# Patient Record
Sex: Female | Born: 1960
Health system: Southern US, Community
[De-identification: ages and names within clinical notes are randomized; demographics above are authoritative.]

## PROBLEM LIST (undated history)

## (undated) DIAGNOSIS — B353 Tinea pedis: Secondary | ICD-10-CM

## (undated) DIAGNOSIS — M76821 Posterior tibial tendinitis, right leg: Secondary | ICD-10-CM

## (undated) DIAGNOSIS — M549 Dorsalgia, unspecified: Secondary | ICD-10-CM

## (undated) DIAGNOSIS — R002 Palpitations: Secondary | ICD-10-CM

## (undated) DIAGNOSIS — I10 Essential (primary) hypertension: Secondary | ICD-10-CM

## (undated) DIAGNOSIS — E785 Hyperlipidemia, unspecified: Secondary | ICD-10-CM

## (undated) DIAGNOSIS — M79671 Pain in right foot: Secondary | ICD-10-CM

## (undated) DIAGNOSIS — E0520 Thyrotoxicosis with toxic multinodular goiter without thyrotoxic crisis or storm: Secondary | ICD-10-CM

## (undated) DIAGNOSIS — E119 Type 2 diabetes mellitus without complications: Secondary | ICD-10-CM

## (undated) DIAGNOSIS — H8110 Benign paroxysmal vertigo, unspecified ear: Secondary | ICD-10-CM

## (undated) DIAGNOSIS — E040 Nontoxic diffuse goiter: Secondary | ICD-10-CM

## (undated) DIAGNOSIS — E6609 Other obesity due to excess calories: Secondary | ICD-10-CM

## (undated) DIAGNOSIS — Z9289 Personal history of other medical treatment: Secondary | ICD-10-CM

## (undated) DIAGNOSIS — Z808 Family history of malignant neoplasm of other organs or systems: Secondary | ICD-10-CM

## (undated) DIAGNOSIS — M2141 Flat foot [pes planus] (acquired), right foot: Secondary | ICD-10-CM

## (undated) DIAGNOSIS — N6089 Other benign mammary dysplasias of unspecified breast: Secondary | ICD-10-CM

## (undated) DIAGNOSIS — R011 Cardiac murmur, unspecified: Secondary | ICD-10-CM

## (undated) HISTORY — DX: Morbid (severe) obesity due to excess calories: E66.01

## (undated) HISTORY — DX: Personal history of other medical treatment: Z92.89

---
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
## 1998-08-01 ENCOUNTER — Emergency Department (HOSPITAL_COMMUNITY): Admission: EM | Admit: 1998-08-01 | Discharge: 1998-08-01 | Payer: Self-pay | Admitting: Emergency Medicine

## 1998-08-02 ENCOUNTER — Encounter: Admission: RE | Admit: 1998-08-02 | Discharge: 1998-08-02 | Payer: Self-pay | Admitting: Sports Medicine

## 1998-09-14 ENCOUNTER — Other Ambulatory Visit: Admission: RE | Admit: 1998-09-14 | Discharge: 1998-09-14 | Payer: Self-pay | Admitting: Family Medicine

## 1998-09-14 ENCOUNTER — Encounter: Admission: RE | Admit: 1998-09-14 | Discharge: 1998-09-14 | Payer: Self-pay | Admitting: Family Medicine

## 1998-09-23 ENCOUNTER — Encounter: Admission: RE | Admit: 1998-09-23 | Discharge: 1998-12-22 | Payer: Self-pay | Admitting: Family Medicine

## 1998-09-28 ENCOUNTER — Encounter: Admission: RE | Admit: 1998-09-28 | Discharge: 1998-09-28 | Payer: Self-pay | Admitting: Family Medicine

## 1998-11-09 ENCOUNTER — Encounter: Admission: RE | Admit: 1998-11-09 | Discharge: 1998-11-09 | Payer: Self-pay | Admitting: Family Medicine

## 1999-06-24 ENCOUNTER — Encounter: Admission: RE | Admit: 1999-06-24 | Discharge: 1999-06-24 | Payer: Self-pay | Admitting: Family Medicine

## 1999-06-28 ENCOUNTER — Encounter: Admission: RE | Admit: 1999-06-28 | Discharge: 1999-06-28 | Payer: Self-pay | Admitting: Sports Medicine

## 2000-01-19 HISTORY — PX: VAGINAL HYSTERECTOMY: SUR661

## 2000-04-17 ENCOUNTER — Encounter: Payer: Self-pay | Admitting: Sports Medicine

## 2000-04-17 ENCOUNTER — Encounter: Admission: RE | Admit: 2000-04-17 | Discharge: 2000-04-17 | Payer: Self-pay | Admitting: Family Medicine

## 2000-04-17 ENCOUNTER — Encounter: Admission: RE | Admit: 2000-04-17 | Discharge: 2000-04-17 | Payer: Self-pay | Admitting: Sports Medicine

## 2000-05-08 ENCOUNTER — Encounter: Admission: RE | Admit: 2000-05-08 | Discharge: 2000-05-08 | Payer: Self-pay | Admitting: Family Medicine

## 2000-06-05 ENCOUNTER — Encounter: Admission: RE | Admit: 2000-06-05 | Discharge: 2000-06-05 | Payer: Self-pay | Admitting: Family Medicine

## 2000-06-07 ENCOUNTER — Emergency Department (HOSPITAL_COMMUNITY): Admission: EM | Admit: 2000-06-07 | Discharge: 2000-06-07 | Payer: Self-pay | Admitting: Emergency Medicine

## 2000-11-06 ENCOUNTER — Encounter: Admission: RE | Admit: 2000-11-06 | Discharge: 2000-11-06 | Payer: Self-pay | Admitting: Sports Medicine

## 2000-11-21 ENCOUNTER — Encounter: Payer: Self-pay | Admitting: Obstetrics and Gynecology

## 2000-11-21 ENCOUNTER — Encounter: Admission: RE | Admit: 2000-11-21 | Discharge: 2000-11-21 | Payer: Self-pay | Admitting: Obstetrics and Gynecology

## 2001-01-21 ENCOUNTER — Encounter (INDEPENDENT_AMBULATORY_CARE_PROVIDER_SITE_OTHER): Payer: Self-pay | Admitting: Specialist

## 2001-01-21 ENCOUNTER — Inpatient Hospital Stay (HOSPITAL_COMMUNITY): Admission: RE | Admit: 2001-01-21 | Discharge: 2001-01-23 | Payer: Self-pay | Admitting: Obstetrics and Gynecology

## 2001-11-17 ENCOUNTER — Encounter (INDEPENDENT_AMBULATORY_CARE_PROVIDER_SITE_OTHER): Payer: Self-pay | Admitting: *Deleted

## 2001-11-17 LAB — CONVERTED CEMR LAB

## 2001-12-16 ENCOUNTER — Encounter: Admission: RE | Admit: 2001-12-16 | Discharge: 2001-12-16 | Payer: Self-pay | Admitting: Family Medicine

## 2002-10-07 ENCOUNTER — Encounter: Admission: RE | Admit: 2002-10-07 | Discharge: 2002-10-07 | Payer: Self-pay | Admitting: Family Medicine

## 2002-10-07 DIAGNOSIS — E66813 Obesity, class 3: Secondary | ICD-10-CM

## 2002-10-07 HISTORY — DX: Obesity, class 3: E66.813

## 2002-10-07 HISTORY — DX: Morbid (severe) obesity due to excess calories: E66.01

## 2002-11-04 ENCOUNTER — Encounter: Admission: RE | Admit: 2002-11-04 | Discharge: 2002-11-04 | Payer: Self-pay | Admitting: Family Medicine

## 2002-12-23 ENCOUNTER — Encounter: Admission: RE | Admit: 2002-12-23 | Discharge: 2002-12-23 | Payer: Self-pay | Admitting: Family Medicine

## 2003-04-14 ENCOUNTER — Encounter: Admission: RE | Admit: 2003-04-14 | Discharge: 2003-04-14 | Payer: Self-pay | Admitting: Family Medicine

## 2003-04-21 ENCOUNTER — Encounter: Admission: RE | Admit: 2003-04-21 | Discharge: 2003-04-21 | Payer: Self-pay | Admitting: Family Medicine

## 2004-05-03 ENCOUNTER — Encounter: Admission: RE | Admit: 2004-05-03 | Discharge: 2004-05-03 | Payer: Self-pay | Admitting: Family Medicine

## 2004-05-20 ENCOUNTER — Encounter: Admission: RE | Admit: 2004-05-20 | Discharge: 2004-05-20 | Payer: Self-pay | Admitting: Sports Medicine

## 2004-08-30 ENCOUNTER — Ambulatory Visit: Payer: Self-pay | Admitting: Family Medicine

## 2005-04-25 ENCOUNTER — Ambulatory Visit: Payer: Self-pay | Admitting: Family Medicine

## 2005-05-29 ENCOUNTER — Ambulatory Visit: Payer: Self-pay | Admitting: Family Medicine

## 2005-08-22 ENCOUNTER — Ambulatory Visit: Payer: Self-pay | Admitting: Family Medicine

## 2006-01-07 ENCOUNTER — Emergency Department (HOSPITAL_COMMUNITY): Admission: EM | Admit: 2006-01-07 | Discharge: 2006-01-07 | Payer: Self-pay | Admitting: Family Medicine

## 2006-05-10 ENCOUNTER — Ambulatory Visit: Payer: Self-pay | Admitting: Family Medicine

## 2007-02-14 DIAGNOSIS — E6609 Other obesity due to excess calories: Secondary | ICD-10-CM

## 2007-02-14 DIAGNOSIS — R011 Cardiac murmur, unspecified: Secondary | ICD-10-CM

## 2007-02-14 DIAGNOSIS — E66811 Obesity, class 1: Secondary | ICD-10-CM

## 2007-02-14 DIAGNOSIS — I10 Essential (primary) hypertension: Secondary | ICD-10-CM

## 2007-02-14 DIAGNOSIS — J309 Allergic rhinitis, unspecified: Secondary | ICD-10-CM | POA: Insufficient documentation

## 2007-02-14 DIAGNOSIS — E785 Hyperlipidemia, unspecified: Secondary | ICD-10-CM

## 2007-02-14 DIAGNOSIS — E1165 Type 2 diabetes mellitus with hyperglycemia: Secondary | ICD-10-CM | POA: Insufficient documentation

## 2007-02-14 DIAGNOSIS — I152 Hypertension secondary to endocrine disorders: Secondary | ICD-10-CM | POA: Insufficient documentation

## 2007-02-14 DIAGNOSIS — E119 Type 2 diabetes mellitus without complications: Secondary | ICD-10-CM

## 2007-02-14 DIAGNOSIS — E1169 Type 2 diabetes mellitus with other specified complication: Secondary | ICD-10-CM | POA: Insufficient documentation

## 2007-02-14 HISTORY — DX: Type 2 diabetes mellitus without complications: E11.9

## 2007-02-14 HISTORY — DX: Other obesity due to excess calories: E66.09

## 2007-02-14 HISTORY — DX: Obesity, class 1: E66.811

## 2007-02-14 HISTORY — DX: Hyperlipidemia, unspecified: E78.5

## 2007-02-14 HISTORY — DX: Cardiac murmur, unspecified: R01.1

## 2007-02-15 ENCOUNTER — Encounter (INDEPENDENT_AMBULATORY_CARE_PROVIDER_SITE_OTHER): Payer: Self-pay | Admitting: *Deleted

## 2007-05-21 ENCOUNTER — Encounter: Payer: Self-pay | Admitting: Family Medicine

## 2007-05-21 ENCOUNTER — Ambulatory Visit: Payer: Self-pay | Admitting: Family Medicine

## 2007-05-21 LAB — CONVERTED CEMR LAB
AST: 11 units/L (ref 0–37)
Albumin: 4 g/dL (ref 3.5–5.2)
Alkaline Phosphatase: 60 units/L (ref 39–117)
BUN: 13 mg/dL (ref 6–23)
Calcium: 9.3 mg/dL (ref 8.4–10.5)
Creatinine, Ser: 0.72 mg/dL (ref 0.40–1.20)
Glucose, Bld: 105 mg/dL — ABNORMAL HIGH (ref 70–99)
HDL: 61 mg/dL (ref >39)
LDL Cholesterol: 124 mg/dL — ABNORMAL HIGH (ref 0–99)
Potassium: 4 meq/L (ref 3.5–5.3)
Total CHOL/HDL Ratio: 3.2
Triglycerides: 55 mg/dL (ref <150)

## 2007-05-28 ENCOUNTER — Encounter (INDEPENDENT_AMBULATORY_CARE_PROVIDER_SITE_OTHER): Payer: Self-pay | Admitting: *Deleted

## 2008-06-11 ENCOUNTER — Ambulatory Visit: Payer: Self-pay | Admitting: Family Medicine

## 2008-06-11 ENCOUNTER — Encounter: Payer: Self-pay | Admitting: Family Medicine

## 2008-06-11 LAB — CONVERTED CEMR LAB
BUN: 9 mg/dL (ref 6–23)
Chloride: 105 meq/L (ref 96–112)
Creatinine, Ser: 0.72 mg/dL (ref 0.40–1.20)
Glucose, Bld: 181 mg/dL — ABNORMAL HIGH (ref 70–99)
Potassium: 4.1 meq/L (ref 3.5–5.3)

## 2008-06-12 ENCOUNTER — Encounter: Payer: Self-pay | Admitting: Family Medicine

## 2008-06-24 ENCOUNTER — Encounter: Admission: RE | Admit: 2008-06-24 | Discharge: 2008-06-24 | Payer: Self-pay | Admitting: Family Medicine

## 2008-09-29 ENCOUNTER — Telehealth: Payer: Self-pay | Admitting: *Deleted

## 2008-09-29 ENCOUNTER — Encounter: Payer: Self-pay | Admitting: *Deleted

## 2008-10-20 ENCOUNTER — Ambulatory Visit: Payer: Self-pay | Admitting: Family Medicine

## 2008-10-20 LAB — CONVERTED CEMR LAB: Hgb A1c MFr Bld: 7.8 %

## 2008-10-21 ENCOUNTER — Encounter: Payer: Self-pay | Admitting: Family Medicine

## 2008-10-21 ENCOUNTER — Ambulatory Visit: Payer: Self-pay | Admitting: Family Medicine

## 2008-10-21 LAB — CONVERTED CEMR LAB
ALT: 15 units/L (ref 0–35)
BUN: 11 mg/dL (ref 6–23)
CO2: 25 meq/L (ref 19–32)
Calcium: 9.2 mg/dL (ref 8.4–10.5)
Chloride: 100 meq/L (ref 96–112)
Cholesterol: 197 mg/dL (ref 0–200)
Creatinine, Ser: 0.67 mg/dL (ref 0.40–1.20)
Glucose, Bld: 121 mg/dL — ABNORMAL HIGH (ref 70–99)
HCT: 36.4 % (ref 36.0–46.0)
HDL: 58 mg/dL (ref >39)
Hemoglobin: 11.9 g/dL — ABNORMAL LOW (ref 12.0–15.0)
MCV: 91.9 fL (ref 78.0–100.0)
Total CHOL/HDL Ratio: 3.4
Triglycerides: 65 mg/dL (ref <150)
WBC: 6.7 10*3/uL (ref 4.0–10.5)

## 2008-10-22 ENCOUNTER — Encounter: Payer: Self-pay | Admitting: Family Medicine

## 2009-02-23 ENCOUNTER — Ambulatory Visit: Payer: Self-pay | Admitting: Family Medicine

## 2009-02-23 LAB — CONVERTED CEMR LAB: Hgb A1c MFr Bld: 8.1 %

## 2010-03-08 ENCOUNTER — Ambulatory Visit: Payer: Self-pay | Admitting: Family Medicine

## 2010-03-08 DIAGNOSIS — E040 Nontoxic diffuse goiter: Secondary | ICD-10-CM

## 2010-03-08 DIAGNOSIS — M542 Cervicalgia: Secondary | ICD-10-CM

## 2010-03-08 HISTORY — DX: Nontoxic diffuse goiter: E04.0

## 2010-04-19 ENCOUNTER — Encounter: Payer: Self-pay | Admitting: Family Medicine

## 2010-04-19 ENCOUNTER — Ambulatory Visit: Payer: Self-pay | Admitting: Family Medicine

## 2010-04-19 LAB — CONVERTED CEMR LAB
Albumin: 4 g/dL (ref 3.5–5.2)
Alkaline Phosphatase: 55 units/L (ref 39–117)
BUN: 11 mg/dL (ref 6–23)
Calcium: 8.9 mg/dL (ref 8.4–10.5)
Creatinine, Ser: 0.67 mg/dL (ref 0.40–1.20)
Glucose, Bld: 139 mg/dL — ABNORMAL HIGH (ref 70–99)
HCT: 37.3 % (ref 36.0–46.0)
HDL: 51 mg/dL (ref >39)
Hemoglobin: 12.1 g/dL (ref 12.0–15.0)
LDL Cholesterol: 82 mg/dL (ref 0–99)
MCHC: 32.4 g/dL (ref 30.0–36.0)
Potassium: 3.9 meq/L (ref 3.5–5.3)
RBC: 4.14 M/uL (ref 3.87–5.11)
Triglycerides: 50 mg/dL (ref <150)

## 2010-05-02 ENCOUNTER — Encounter: Payer: Self-pay | Admitting: Family Medicine

## 2010-05-24 ENCOUNTER — Ambulatory Visit: Payer: Self-pay | Admitting: Family Medicine

## 2010-10-18 ENCOUNTER — Encounter (INDEPENDENT_AMBULATORY_CARE_PROVIDER_SITE_OTHER): Payer: Self-pay | Admitting: Pharmacist

## 2010-10-25 ENCOUNTER — Encounter: Admission: RE | Admit: 2010-10-25 | Discharge: 2010-10-25 | Payer: Self-pay | Admitting: Family Medicine

## 2010-11-15 ENCOUNTER — Ambulatory Visit: Payer: Self-pay | Admitting: Family Medicine

## 2011-01-08 ENCOUNTER — Encounter: Payer: Self-pay | Admitting: Sports Medicine

## 2011-01-17 NOTE — Assessment & Plan Note (Signed)
Summary: cpe,df   Vital Signs:  Patient profile:   50 year old female Height:      65 inches Weight:      247 pounds BMI:     41.25 Temp:     98.1 degrees F oral Pulse rate:   88 / minute BP sitting:   132 / 84  (right arm) Cuff size:   large  Vitals Entered By: Tessie Fass CMA (March 08, 2010 3:44 PM) CC: F/U BP and diabetes Is Patient Diabetic? Yes Pain Assessment Patient in pain? no        Primary Care Provider:  Zachery Dauer MD  CC:  F/U BP and diabetes.  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 Gets a lot of walking in on the second job, but no other exercise. Neck has been hurting into the left shoulder, but  no arm symptoms.   The glucose monitor she got from the nutrition counseling broke after a month.We reviewed symptoms of hypo and hyperglycemia and she denied either type. She has been taking the Metformin 1 two times a day, and now is slightly constipated. Stopped Glipizide after one month due to shakiness. Hasn't been checking sugars due to no strips having been provided.   Using athletes foot cream between toes for irritation there.  Habits & Providers  Alcohol-Tobacco-Diet     Tobacco Status: never  Allergies: No Known Drug Allergies  Physical Exam  General:  Obese, multiple tattoosalert.   Neck:  Possible symmetric thyromegaly. Full range of motion. Pain upper left shoulder with rotation of neck to left  Lungs:  Normal respiratory effort, chest expands symmetrically. Lungs are clear to auscultation, no crackles or wheezes. Heart:  Normal rate and regular rhythm. S1 and S2 normal without gallop, murmur, click, rub or other extra sounds. Msk:  strength, sensation and reflexes normal in the arms.  Extremities:  complains of  toenails ingrowing. Slightly tender on corners, but no signs of infection.   Diabetes Management Exam:    Foot Exam (with socks and/or shoes not present):  Sensory-Pinprick/Light touch:          Left medial foot (L-4): normal          Left dorsal foot (L-5): normal          Left lateral foot (S-1): normal          Right medial foot (L-4): normal          Right dorsal foot (L-5): normal          Right lateral foot (S-1): normal       Sensory-Monofilament:          Left foot: normal          Right foot: normal       Inspection:          Left foot: normal          Right foot: normal       Nails:          Left foot: normal          Right foot: normal   Impression & Recommendations:  Problem # 1:  DIABETES MELLITUS II, UNCOMPLICATED (ICD-250.00) Discussed the complictions of poorly controlled DM The following medications were removed from the medication list:    Anacin 81 Mg Tbec (Aspirin)    Glipizide 5 Mg Tabs (Glipizide) .Marland Kitchen... Take one tab once daily Her updated medication  list for this problem includes:    Benazepril-hydrochlorothiazide 20-12.5 Mg Tabs (Benazepril-hydrochlorothiazide) .Marland Kitchen..Marland Kitchen Two times a day    Metformin Hcl 1000 Mg Tabs (Metformin hcl) ..... One and a half tab in am and one in pm    Aspirin 81 Mg Tbec (Aspirin) .Marland Kitchen... Take one tablet daily  Orders: A1C-[facility] (46962) [facility]- Est  Level 4 (99214)Future Orders: Comp Met-[facility] ([id number]) ... 02/20/2011  Problem # 2:  NECK PAIN, LEFT (ICD-723.1) No radicular signs, so likely related to degenerative joint disease. To get supprt pillow and position self so not looking up too much at work.  The following medications were removed from the medication list:    Anacin 81 Mg Tbec (Aspirin) Her updated medication list for this problem includes:    Aspirin 81 Mg Tbec (Aspirin) .Marland Kitchen... Take one tablet daily  Problem # 3:  MORBID OBESITY (ICD-278.01) Assessment: Deteriorated Patient made action plan to lose 2 lbs in next month.  Problem # 4:  HYPERTENSION, BENIGN SYSTEMIC (ICD-401.1) close to control Her updated medication list for this problem includes:     Benazepril-hydrochlorothiazide 20-12.5 Mg Tabs (Benazepril-hydrochlorothiazide) .Marland Kitchen..Marland Kitchen Two times a day  Orders: [facility]- Est  Level 4 (99214)Future Orders: Comp Met-[facility] ([id number]) ... 02/20/2011  Problem # 5:  HYPERLIPIDEMIA (ICD-272.4) Restart med then recheck. Her updated medication list for this problem includes:    Simvastatin 20 Mg Tabs (Simvastatin) .Marland Kitchen... Take 1 tab at bedtime  Orders: [facility]- Est  Level 4 (99214)Future Orders: Lipid-[facility] ([id number]) ... 02/20/2011  Problem # 6:  GOITER, SIMPLE (ICD-240.0) possibly enlargedd.  Future Orders: TSH-[facility] ([id number]) ... 03/20/2011  Problem # 7:  MURMUR, FUNCTIONAL (ICD-785.2) Not heard today  Complete Medication List: 1)  Benazepril-hydrochlorothiazide 20-12.5 Mg Tabs (Benazepril-hydrochlorothiazide) .... Two times a day 2)  Metformin Hcl 1000 Mg Tabs (Metformin hcl) .... One and a half tab in am and one in pm 3)  Simvastatin 20 Mg Tabs (Simvastatin) .... Take 1 tab at bedtime 4)  Freestyle Freedom Lite W/device Kit (Blood glucose monitoring suppl) .... Check blood sugar once daily 5)  Freestyle Lancets Misc (Lancets) .... Use as directed 6)  Freestyle Lite Test Strp (Glucose blood) .... Check your fasting blood sugar in the mornings. 7)  Aspirin 81 Mg Tbec (Aspirin) .... Take one tablet daily  Other Orders: Future Orders: CBC-[facility] ([id number]) ... 02/20/2011  Patient Instructions: 1)  Walk 15 minutes 3 days a week. 2)  Replace sugar sweetened drinks with water.  3)  Eat 3 meals daily. More fruits.  4)  Please schedule a follow-up appointment in 2 weeks.  5)  Please return for a FASTING Lipid Profile one(1) week before your next appointment as scheduled.  6)  Your A1c today is 9.9 Goal under 7.0 7)  You will increase the Metformin to 1 1/2 in the AM and 1 in the PM. 8)  Call Dr Sheffield Slider if bowels too loose or fasting blood sugars are staying above 200.  Prescriptions: SIMVASTATIN 20 MG TABS (SIMVASTATIN) Take 1 tab at bedtime  #30  x 11   Entered and Authorized by:   Zachery Dauer MD   Signed by:   Zachery Dauer MD on 03/08/2010   Method used:   Electronically to        [facility] [phone]* (retail)       [address]       [place], [place]  [address]       Ph: [phone] or [phone]  Fax: [phone]   RxID:   [id number] BENAZEPRIL-HYDROCHLOROTHIAZIDE 20-12.5 MG  TABS (BENAZEPRIL-HYDROCHLOROTHIAZIDE) two times a day  #60 Tablet x 10   Entered and Authorized by:   Zachery Dauer MD   Signed by:   Zachery Dauer MD on 03/08/2010   Method used:   [facility] to        [facility]. [phone]* (retail)       [address] [place]  [address]       Ph: [phone] or [phone]       Fax: [phone]   RxID:   [id number] METFORMIN HCL 1000 MG  TABS (METFORMIN HCL) One and a half tab in AM and one in PM  #75 x 2   Entered and Authorized by:   Zachery Dauer MD   Signed by:   Zachery Dauer MD on 03/08/2010   Method used:   [facility]ron[facility] to        [facility] [phone]* (retail)       [address], [place]  [address]       Ph: [phone] or [phone]       Fax: [phone]   RxID:   [id number] FREESTYLE LITE TEST  STRP (GLUCOSE BLOOD) Check your fasting blood sugar in the mornings.  #1 bottle x 11   Entered and Authorized by:   Zachery Dauer MD   Signed by:   Zachery Dauer MD on 03/08/2010   Method used:   [facility]        [facility] [phone]* (retail)       [address]  [address]       Ph: [phone] or [phone]       Fax: [phone]   RxID:   [id number]   Laboratory Results   Blood Tests   Date/Time Received: March 08, 2010 3:37 PM  Date/Time Reported: March 08, 2010 3:53 PM   HGBA1C: 9.9%   (Normal Range: Non-Diabetic - 3-6%   Control Diabetic - 6-8%)  Comments: ...........test performed by...........Marland KitchenTerese Door, CMA      Prevention & Chronic Care Immunizations   Influenza vaccine: Not documented    Influenza vaccine due: Refused  (10/20/2008)    Tetanus booster: 04/17/2006: Done.   Tetanus booster due: 04/17/2016    Pneumococcal vaccine: Done.  (09/28/1998)   Pneumococcal vaccine due: None  Other Screening   Pap smear: Done.  (11/17/2001)   Pap smear action/deferral: Not indicated S/P hysterectomy  (03/08/2010)   Pap smear due: 11/17/2002    Mammogram: normal  (06/22/2008)   Mammogram due: 06/22/2009   Smoking status: never  (03/08/2010)  Diabetes Mellitus   HgbA1C: 9.9  (03/08/2010)   Hemoglobin A1C due: 09/11/2008    Eye exam: Not documented    Foot exam: yes  (03/08/2010)   High risk foot: Not documented   Foot care education: completed  (10/20/2008)   Foot exam due: 04/19/2009    Urine microalbumin/creatinine ratio: Not documented   Urine microalbumin/cr due: 05/20/2008    Diabetes flowsheet reviewed?: Yes   Progress toward A1C goal: Deteriorated  Lipids   Total Cholesterol: 197  (10/21/2008)   LDL: 126  (10/21/2008)   LDL Direct: Not documented   HDL: 58  (10/21/2008)   Triglycerides: 65  (10/21/2008)    SGOT (AST): 15  (10/21/2008)   SGPT (  ALT): 15  (10/21/2008) CMP ordered    Alkaline phosphatase: 55  (10/21/2008)   Total bilirubin: 0.7  (10/21/2008)    Lipid flowsheet reviewed?: Yes   Progress toward LDL goal: Unchanged  Hypertension   Last Blood Pressure: 132 / 84  (03/08/2010)   Serum creatinine: 0.67  (10/21/2008)   Serum potassium 3.8  (10/21/2008) CMP ordered     Hypertension flowsheet reviewed?: Yes   Progress toward BP goal: Deteriorated  Self-Management Support :   Personal Goals (by the next clinic visit) :     Personal A1C goal: 7  (03/08/2010)     Personal blood pressure goal: 130/80  (03/08/2010)     Personal LDL goal: 100  (03/08/2010)    Diabetes self-management support: Not documented    Hypertension self-management support: Not documented    Lipid self-management support: Not documented

## 2011-01-17 NOTE — Assessment & Plan Note (Signed)
Summary: fu/kh   Vital Signs:  Patient profile:   50 year old female Menstrual status:  hysterectomy Height:      65 inches Weight:      240.5 pounds BMI:     40.17 Pulse rate:   84 / minute BP sitting:   121 / 77  (left arm) Cuff size:   large  Vitals Entered By: Arlyss Repress CMA, (May 24, 2010 4:05 PM) CC: f/up DM Is Patient Diabetic? Yes Pain Assessment Patient in pain? no          Menstrual Status hysterectomy Last PAP Result Done.   Primary Care Provider:  Zachery Dauer MD  CC:  f/up DM.  History of Present Illness: Has been taking the Metformin without diarrhea. Walking 15 minutes 3 times weekly in addition to the walking that she does on her second job at [employer]. Also drinking Zango juice $40 a bottle that her daughter recommended. Not checking her sugars due to lack of strips. Feeling well. without foot problems. Still holding off eye exam till sugars stable. Afraid to take Glipizide due to the hypoglycemic episodes that she had.   Current Medications (verified): 1)  Benazepril-Hydrochlorothiazide 20-12.5 Mg  Tabs (Benazepril-Hydrochlorothiazide) .... Two Times A Day 2)  Metformin Hcl 1000 Mg  Tabs (Metformin Hcl) .... One and A Half Tab in Am and One in Pm 3)  Simvastatin 20 Mg Tabs (Simvastatin) .... Take 1 Tab At Bedtime 4)  Freestyle Freedom Lite W/device Kit (Blood Glucose Monitoring Suppl) .... Check Blood Sugar Once Daily 5)  Freestyle Lancets  Misc (Lancets) .... Use As Directed 6)  Freestyle Lite Test  Strp (Glucose Blood) .... Check Your Fasting Blood Sugar in The Mornings. 7)  Aspirin 81 Mg Tbec (Aspirin) .... Take One Tablet Daily 8)  Glyburide 1.25 Mg Tabs (Glyburide) .... Take One Tablet Daily  Allergies (verified): No Known Drug Allergies  Social History: divorced, had  boyfriend, broke it off, too immature Works [employer], evenings at [employer], [place], born [birth year] Non-smoker  Physical Exam  General:  Obese, multiple tattoo  salert.   Lungs:  Normal respiratory effort, chest expands symmetrically. Lungs are clear to auscultation, no crackles or wheezes. Heart:  Normal rate and regular rhythm. S1 and S2 normal Grade   1/6 systolic ejection murmur aortic area Extremities:  No pedal lesions    Impression & Recommendations:  Problem # 1:  DIABETES MELLITUS II, UNCOMPLICATED (ICD-250.00) Assessment Improved  Her updated medication list for this problem includes:    Benazepril-hydrochlorothiazide 20-12.5 Mg Tabs (Benazepril-hydrochlorothiazide) .Marland Kitchen..Marland Kitchen Two times a day    Metformin Hcl 1000 Mg Tabs (Metformin hcl) ..... One and a half tab in am and one in pm    Aspirin 81 Mg Tbec (Aspirin) .Marland Kitchen... Take one tablet daily    Glyburide 1.25 Mg Tabs (Glyburide) .Marland Kitchen... Take one tablet daily  Orders: A1C-[facility] (84696) [facility]- Est Level  3 (29528)  Problem # 2:  HYPERTENSION, BENIGN SYSTEMIC (ICD-401.1) Assessment: Improved  Her updated medication list for this problem includes:    Benazepril-hydrochlorothiazide 20-12.5 Mg Tabs (Benazepril-hydrochlorothiazide) .Marland Kitchen..Marland Kitchen Two times a day  Orders: [facility]- Est Level  3 (41324)  Problem # 3:  MORBID OBESITY (ICD-278.01) Assessment: Improved  Orders: [facility]- Est Level  3 (40102)  Problem # 4:  MURMUR, FUNCTIONAL (ICD-785.2) Assessment: Unchanged  Complete Medication List: 1)  Benazepril-hydrochlorothiazide 20-12.5 Mg Tabs (Benazepril-hydrochlorothiazide) .... Two times a day 2)  Metformin Hcl 1000 Mg Tabs (Metformin hcl) .... One and a  half tab in am and one in pm 3)  Simvastatin 20 Mg Tabs (Simvastatin) .... Take 1 tab at bedtime 4)  Freestyle Freedom Lite W/device Kit (Blood glucose monitoring suppl) .... Check blood sugar once daily 5)  Freestyle Lancets Misc (Lancets) .... Use as directed 6)  Freestyle Lite Test Strp (Glucose blood) .... Check your fasting blood sugar in the mornings. 7)  Aspirin 81 Mg Tbec (Aspirin) .... Take one tablet daily 8)  Glyburide 1.25 Mg Tabs  (Glyburide) .... Take one tablet daily  Patient Instructions: 1)  Increase your walking sessions by a minute each week. 2)  Add another exercise two days weekly for the same amount. 3)  Congratulations, your A1c is 8.3 which is much better than 9.9 4)  Please schedule a follow-up appointment in 3 months .  Prescriptions: GLYBURIDE 1.25 MG TABS (GLYBURIDE) Take one tablet daily  #30 x 5   Entered and Authorized by:   Zachery Dauer MD   Signed by:   Zachery Dauer MD on 05/24/2010   Method used:   [facility] to        [facility] [phone]* (retail)       [address] [place]  [address]       Ph: [phone] or [phone]       Fax: [phone]   RxID:   [id number]    Prevention & Chronic Care Immunizations   Influenza vaccine: Not documented   Influenza vaccine due: Refused  (10/20/2008)    Tetanus booster: 04/17/2006: Done.   Tetanus booster due: 04/17/2016    Pneumococcal vaccine: Done.  (09/28/1998)   Pneumococcal vaccine due: None  Other Screening   Pap smear: Done.  (11/17/2001)   Pap smear action/deferral: Not indicated S/P hysterectomy  (03/08/2010)   Pap smear due: 11/17/2002    Mammogram: normal  (06/22/2008)   Mammogram due: 06/22/2009   Smoking status: never  (03/08/2010)  Diabetes Mellitus   HgbA1C: 8.3  (05/24/2010)   Hemoglobin A1C due: 09/11/2008    Eye exam: Not documented    Foot exam: yes  (03/08/2010)   High risk foot: Not documented   Foot care education: completed  (10/20/2008)   Foot exam due: 04/19/2009    Urine microalbumin/creatinine ratio: Not documented   Urine microalbumin/cr due: 05/20/2008  Lipids   Total Cholesterol: 143  (04/19/2010)   LDL: 82  (04/19/2010)   LDL Direct: Not documented   HDL: 51  (04/19/2010)   Triglycerides: 50  (04/19/2010)    SGOT (AST): 10  (04/19/2010)   SGPT (ALT): 12  (04/19/2010)   Alkaline phosphatase: 55  (04/19/2010)   Total bilirubin: 0.9  (04/19/2010)    Lipid  flowsheet reviewed?: Yes   Progress toward LDL goal: At goal  Hypertension   Last Blood Pressure: 121 / 77  (05/24/2010)   Serum creatinine: 0.67  (04/19/2010)   Serum potassium 3.9  (04/19/2010)    Hypertension flowsheet reviewed?: Yes   Progress toward BP goal: At goal  Self-Management Support :   Personal Goals (by the next clinic visit) :     Personal A1C goal: 7  (03/08/2010)     Personal blood pressure goal: 130/80  (03/08/2010)     Personal LDL goal: 100  (03/08/2010)    Diabetes self-management support: Not documented    Hypertension self-management support: Not documented    Lipid self-management support: Not documented      Laboratory Results  Blood Tests   Date/Time Received: May 24, 2010 4:07 PM  Date/Time Reported: May 24, 2010 4:49 PM   HGBA1C: 8.3%   (Normal Range: Non-Diabetic - 3-6%   Control Diabetic - 6-8%)  Comments: ...........test performed by...........Marland KitchenTerese Door, CMA

## 2011-01-17 NOTE — Assessment & Plan Note (Signed)
Summary: f/u dm,df   Vital Signs:  Patient profile:   50 year old female Menstrual status:  hysterectomy Height:      65 inches Weight:      237 pounds BMI:     39.58 Temp:     98.6 degrees F oral Pulse rate:   80 / minute BP sitting:   93 / 62  (left arm) Cuff size:   large  Vitals Entered By: Tessie Fass CMA (November 15, 2010 4:32 PM)  Serial Vital Signs/Assessments:  Time      Position  BP       Pulse  Resp  Temp     By 5:07 PM             128/82                         Zachery Dauer MD  CC: F/U DM Is Patient Diabetic? Yes Pain Assessment Patient in pain? no        Primary Care Provider:  Zachery Dauer MD  CC:  F/U DM.  History of Present Illness: DM - no hypoglycemia symptoms. No diarrhea now that she is taking the Metformin regularly. Never got the Glipizide because she understood that she should try taking the Metformin regularly first.   Htn - not lightheaded.  Wt - has cut out sugar sweetened drinks. Not exercising. Still working 2 jobs.   Habits & Providers  Alcohol-Tobacco-Diet     Tobacco Status: never  Current Medications (verified): 1)  Benazepril-Hydrochlorothiazide 20-12.5 Mg  Tabs (Benazepril-Hydrochlorothiazide) .... Two Times A Day 2)  Metformin Hcl 1000 Mg  Tabs (Metformin Hcl) .... One and A Half Tab in Am and One in Pm 3)  Simvastatin 20 Mg Tabs (Simvastatin) .... Take 1 Tab At Bedtime 4)  Freestyle Freedom Lite W/device Kit (Blood Glucose Monitoring Suppl) .... Check Blood Sugar Once Daily 5)  Freestyle Lancets  Misc (Lancets) .... Use As Directed 6)  Freestyle Lite Test  Strp (Glucose Blood) .... Check Your Fasting Blood Sugar in The Mornings. 7)  Aspirin 81 Mg Tbec (Aspirin) .... Take One Tablet Daily 8)  Glyburide 1.25 Mg Tabs (Glyburide) .... Take One Tablet Daily  Allergies (verified): No Known Drug Allergies  Physical Exam  General:  Obese, multiple tattos alert.   Lungs:  Normal respiratory effort, chest expands symmetrically.  Lungs are clear to auscultation, no crackles or wheezes. Heart:  Normal rate and regular rhythm. S1 and S2 normal Grade   1/6 systolic ejection murmur aortic area   Impression & Recommendations:  Problem # 1:  DIABETES MELLITUS II, UNCOMPLICATED (ICD-250.00) Assessment Improved  Her updated medication list for this problem includes:    Benazepril-hydrochlorothiazide 20-12.5 Mg Tabs (Benazepril-hydrochlorothiazide) .Marland Kitchen..Marland Kitchen Two times a day    Metformin Hcl 1000 Mg Tabs (Metformin hcl) ..... One and a half tab in am and one in pm    Aspirin 81 Mg Tbec (Aspirin) .Marland Kitchen... Take one tablet daily    Glyburide 1.25 Mg Tabs (Glyburide) .Marland Kitchen... Take one tablet daily  Orders: A1C-[facility] (98119) [facility]- Est Level  3 (14782)  Problem # 2:  HYPERTENSION, BENIGN SYSTEMIC (ICD-401.1)  Initial low blood pressure appears to have been errroneous Her updated medication list for this problem includes:    Benazepril-hydrochlorothiazide 20-12.5 Mg Tabs (Benazepril-hydrochlorothiazide) .Marland Kitchen..Marland Kitchen Two times a day  Orders: [facility]- Est Level  3 (95621)  Problem # 3:  MORBID OBESITY (ICD-278.01)  Congratulated on weight loss. Encouraged weight  loss  Orders: [facility]- Est Level  3 (24401)  Complete Medication List: 1)  Benazepril-hydrochlorothiazide 20-12.5 Mg Tabs (Benazepril-hydrochlorothiazide) .... Two times a day 2)  Metformin Hcl 1000 Mg Tabs (Metformin hcl) .... One and a half tab in am and one in pm 3)  Simvastatin 20 Mg Tabs (Simvastatin) .... Take 1 tab at bedtime 4)  Freestyle Freedom Lite W/device Kit (Blood glucose monitoring suppl) .... Check blood sugar once daily 5)  Freestyle Lancets Misc (Lancets) .... Use as directed 6)  Freestyle Lite Test Strp (Glucose blood) .... Check your fasting blood sugar in the mornings. 7)  Aspirin 81 Mg Tbec (Aspirin) .... Take one tablet daily 8)  Glyburide 1.25 Mg Tabs (Glyburide) .... Take one tablet daily  Patient Instructions: 1)  Try high fiber pasta.  2)  Try adding more  walking 3)  Please schedule a follow-up appointment in 3 months .  4)  Congratulations, your A1c is  7.4 which is much better than 8.3 5)  Goal under 7.0 6)  Please schedule a follow-up appointment in 3 months .    Orders Added: 1)  A1C-[facility] [83036] 2)  [facility]- Est Level  3 [99213]    Laboratory Results   Blood Tests   Date/Time Received: November 15, 2010 4:30 PM  Date/Time Reported: November 15, 2010 4:51 PM   HGBA1C: 7.4%   (Normal Range: Non-Diabetic - 3-6%   Control Diabetic - 6-8%)  Comments: ...........test performed by...........Marland KitchenTessie Fass, CMA entered by Terese Door, CMA          Prevention & Chronic Care Immunizations   Influenza vaccine: Not documented   Influenza vaccine deferral: Refused  (11/15/2010)   Influenza vaccine due: Refused  (10/20/2008)    Tetanus booster: 04/17/2006: Done.   Tetanus booster due: 04/17/2016    Pneumococcal vaccine: Done.  (09/28/1998)   Pneumococcal vaccine due: None  Other Screening   Pap smear: Done.  (11/17/2001)   Pap smear action/deferral: Not indicated S/P hysterectomy  (03/08/2010)   Pap smear due: 11/17/2002    Mammogram: ASSESSMENT: Negative - BI-RADS 1^MM DIGITAL SCREENING  (10/25/2010)   Mammogram action/deferral: Screening mammogram in 1 year.     (10/25/2010)   Mammogram due: 06/22/2009   Smoking status: never  (11/15/2010)  Diabetes Mellitus   HgbA1C: 7.4  (11/15/2010)   Hemoglobin A1C due: 09/11/2008    Eye exam: Not documented    Foot exam: yes  (03/08/2010)   High risk foot: Not documented   Foot care education: completed  (10/20/2008)   Foot exam due: 04/19/2009    Urine microalbumin/creatinine ratio: Not documented   Urine microalbumin/cr due: 05/20/2008    Diabetes flowsheet reviewed?: Yes   Progress toward A1C goal: Improved  Lipids   Total Cholesterol: 143  (04/19/2010)   LDL: 82  (04/19/2010)   LDL Direct: Not documented   HDL: 51  (04/19/2010)   Triglycerides: 50   (04/19/2010)    SGOT (AST): 10  (04/19/2010)   SGPT (ALT): 12  (04/19/2010)   Alkaline phosphatase: 55  (04/19/2010)   Total bilirubin: 0.9  (04/19/2010)    Lipid flowsheet reviewed?: Yes   Progress toward LDL goal: At goal  Hypertension   Last Blood Pressure: 93 / 62  (11/15/2010)   Serum creatinine: 0.67  (04/19/2010)   Serum potassium 3.9  (04/19/2010)    Hypertension flowsheet reviewed?: Yes   Progress toward BP goal: At goal  Self-Management Support :   Personal Goals (by the next clinic visit) :  Personal A1C goal: 7  (03/08/2010)     Personal blood pressure goal: 130/80  (03/08/2010)     Personal LDL goal: 100  (03/08/2010)    Diabetes self-management support: Not documented    Hypertension self-management support: Not documented    Lipid self-management support: Not documented

## 2011-01-17 NOTE — Letter (Signed)
Summary: Results Follow-up Letter  [facility]  [address]   [place], [place] [address]   Phone: [phone]  Fax: [phone]    05/02/2010  [address] [place], [place]  [address]  Dear Ms. Apgar,   The following are the results of your recent test(s): Patient: Jasmine Dickson   Tests: (1) CBC NO Diff (Complete Blood Count) (10000)   Order Note: FASTING   WBC                       7.3 K/uL                    4.0-10.5   RBC                       4.14 MIL/uL                 3.87-5.11   Hemoglobin                12.1 g/dL                   57.8-46.9   Hematocrit                37.3 %                      36.0-46.0   MCV                       90.1 fL                     78.0-100.0 ! MCH                       29.2 pg                     26.0-34.0   MCHC                      32.4 g/dL                   62.9-52.8   RDW                       13.3 %                      11.5-15.5   Platelet Count            325 K/uL                    150-400 Your blood count is normal.   Tests: (2) Comprehensive Metabolic Panel (41324)   Sodium                    137 mEq/L                   135-145   Potassium                 3.9 mEq/L                   3.5-5.3   Chloride                  99 mEq/L  96-112   CO2                       25 mEq/L                    19-32   Glucose              [H]  139 mg/dL                   16-07   BUN                       11 mg/dL                    3-71   Creatinine                0.67 mg/dL                  0.40-1.20   Bilirubin, Total          0.9 mg/dL                   0.6-2.6   Alkaline Phosphatase      55 U/L                      39-117   AST/SGOT                  10 U/L                      0-37   ALT/SGPT                  12 U/L                      0-35   Total Protein             6.6 g/dL                    9.4-8.5   Albumin                   4.0 g/dL                    4.6-2.7   Calcium                   8.9 mg/dL                    0.3-50.0 Your liver and kidneys are normal Tests: (3) Lipid Profile (93818)   Cholesterol               143 mg/dL                   2-993        Triglyceride              50 mg/dL                    <716   HDL Cholesterol           51 mg/dL                    >96   Total Chol/HDL Ratio      2.8 Ratio  VLDL Cholesterol (Calc)  10 mg/dL                    1-61  LDL Cholesterol (Calc)                             82 mg/dL                    0-96           Total Cholesterol/HDL Ratio:CHD Risk                            Coronary Heart Disease Risk Table                                            Men       Women              1/2 Average Risk              3.4        3.3                  Average Risk              5.0        4.4              2 X Average Risk              9.6        7.1              3 X Average Risk             23.4       11.0     Use the calculated Patient Ratio above and the CHD Risk table      to determine the patient's CHD Risk.       Tests: (4) TSH (23280)   TSH                       1.411 uIU/mL                0.350-4.500     ***Test methodology is 3rd generation TSH*** Your thyroid is working normally Document Creation Date: 04/20/2010 12:46 AM _______________________________________________________________________ Cholesterol LDL(Bad cholesterol):   82       Your goal is less than: 100       HDL (Good cholesterol): 51       Your goal is more than: 50 _________________________________________________________  Sincerely,  Zachery Dauer MD [facility]            Appended Document: Results Follow-up Letter mailed.

## 2011-01-17 NOTE — Miscellaneous (Signed)
Summary: Orders Update  Clinical Lists Changes  Problems: Added new problem of ENCOUNTER FOR LONG-TERM USE OF OTHER MEDICATIONS (ICD-V58.69) Orders: Added new Test order of B12-[facility] (82607-23330) - Signed Added new Test order of CBC-[facility] (85027) - Signed   Ok per Dr. Hale 

## 2011-03-09 ENCOUNTER — Other Ambulatory Visit: Payer: Self-pay | Admitting: Family Medicine

## 2011-03-09 NOTE — Telephone Encounter (Signed)
Refill request

## 2011-03-27 ENCOUNTER — Telehealth: Payer: Self-pay | Admitting: Family Medicine

## 2011-03-27 ENCOUNTER — Other Ambulatory Visit: Payer: Self-pay | Admitting: Family Medicine

## 2011-03-27 MED ORDER — METFORMIN HCL 1000 MG PO TABS: 1000.0000 mg | ORAL_TABLET | ORAL | Status: DC

## 2011-03-27 MED ORDER — BENAZEPRIL-HYDROCHLOROTHIAZIDE 20-12.5 MG PO TABS: 1.0000 | ORAL_TABLET | Freq: Every day | ORAL | Status: DC

## 2011-03-27 NOTE — Telephone Encounter (Signed)
Pt states that she needs 90 day supply for her insurance to pay - Metformin & Benaz/HCTZ [facility] [place]

## 2011-04-04 ENCOUNTER — Encounter: Payer: Self-pay | Admitting: Family Medicine

## 2011-04-04 ENCOUNTER — Ambulatory Visit (INDEPENDENT_AMBULATORY_CARE_PROVIDER_SITE_OTHER): Payer: BC Managed Care – PPO | Admitting: Family Medicine

## 2011-04-04 VITALS — BP 119/79 | HR 90 | Ht 65.0 in | Wt 237.5 lb

## 2011-04-04 DIAGNOSIS — R011 Cardiac murmur, unspecified: Secondary | ICD-10-CM

## 2011-04-04 DIAGNOSIS — E040 Nontoxic diffuse goiter: Secondary | ICD-10-CM

## 2011-04-04 DIAGNOSIS — M25519 Pain in unspecified shoulder: Secondary | ICD-10-CM

## 2011-04-04 DIAGNOSIS — M542 Cervicalgia: Secondary | ICD-10-CM

## 2011-04-04 DIAGNOSIS — E669 Obesity, unspecified: Secondary | ICD-10-CM

## 2011-04-04 DIAGNOSIS — E785 Hyperlipidemia, unspecified: Secondary | ICD-10-CM

## 2011-04-04 DIAGNOSIS — E119 Type 2 diabetes mellitus without complications: Secondary | ICD-10-CM

## 2011-04-04 DIAGNOSIS — M25512 Pain in left shoulder: Secondary | ICD-10-CM | POA: Insufficient documentation

## 2011-04-04 LAB — POCT GLYCOSYLATED HEMOGLOBIN (HGB A1C): Hemoglobin A1C: 7.1

## 2011-04-04 MED ORDER — DICLOFENAC SODIUM 75 MG PO TBEC: 75.0000 mg | DELAYED_RELEASE_TABLET | Freq: Two times a day (BID) | ORAL | Status: DC

## 2011-04-04 NOTE — Patient Instructions (Addendum)
Please schedule an appointment in 6 months to see Dr Sheffield Slider  Schedule a fasting lab test in May for cholesterol   Recheck with me sooner if your neck gets worse. Get a memory foam pillow at [employer] and try to sleep on your back.

## 2011-04-04 NOTE — Progress Notes (Signed)
  Subjective:    Patient ID: Jasmine Dickson, female    DOB: February 13, 1961, 50 y.o.   MRN: [id number]  HPI intermittently she has left neck and shoulder pain that has not responded well to ibuprofen 800 mg. She denies any numbness or weakness in the left arm. She is to work a lot above head level has tried changing positions to counteract this. When painful is difficult to lift her left shoulder and it does bother her at nighttime. She does sleep on her sides and has to do a lot of readjusting her pillows. Her hands will obtain old awaken her at night so that she has to shake them. She tried her boyfriend's tramadol but is not helpf  She is occasional knee pains particularly in the right which tends to lock up.  She is warts in the right hand which are not bothersome.  Her athlete's foot and recurs and she uses over-the-counter medication to which her response  She has not been checking her blood sugars but has had no episodes of hypoglycemia.She is tolerating the metformin well without episodes of diarrhea.   Review of Systems  Constitutional: Positive for activity change. Negative for unexpected weight change.  HENT: Positive for neck pain.   Eyes: Negative for visual disturbance.  Respiratory: Negative for chest tightness.   Cardiovascular: Negative for chest pain.  Genitourinary: Negative for frequency.  Musculoskeletal: Positive for arthralgias.       Objective:   Physical Exam  Constitutional: She is oriented to person, place, and time. No distress.       Generalized obesity  Neck: Normal range of motion. Neck supple. No tracheal deviation present. No thyromegaly present.  Cardiovascular: Normal rate and regular rhythm.   Murmur heard.  Crescendo decrescendo systolic murmur is present with a grade of 2/6       LSB  Pulmonary/Chest: Effort normal and breath sounds normal.  Musculoskeletal: Normal range of motion.  Lymphadenopathy:    She has no cervical adenopathy.  Neurological:  She is alert and oriented to person, place, and time. No cranial nerve deficit.       Strength normal in the arms No thenar atrophy  Skin:       Warts on the fingers and palms, greater on the R   Desquamating skin between toes and on plantar surface of both feet. Tinea type changes in the toenails. L great toenail is horse shoe shaped and starting to ingrow medially.   Psychiatric: She has a normal mood and affect. Her behavior is normal. Judgment and thought content normal.          Assessment & Plan:

## 2011-04-05 NOTE — Assessment & Plan Note (Signed)
Appears to be musculoskeletal. No signs of radiculopathy. Mild bilateral carpal tunnel type symptoms.  

## 2011-04-05 NOTE — Assessment & Plan Note (Addendum)
Feels like overlying muscle thickening today. TSH normal last year.

## 2011-04-05 NOTE — Assessment & Plan Note (Addendum)
No symptoms to suggest that it is hemodynamically significant. Will check hemoglobin next blood draw.

## 2011-04-05 NOTE — Assessment & Plan Note (Signed)
Appears to be musculoskeletal. No signs of radiculopathy. Mild bilateral carpal tunnel type symptoms.

## 2011-04-05 NOTE — Assessment & Plan Note (Signed)
Close to ideal control. I emphasized the importance of reducing weight for diabetes and arthritis.

## 2011-05-05 NOTE — Op Note (Signed)
[facility]  Patient:    Jasmine Dickson, Jasmine Dickson                       MRN: [id number] Proc. Date: 01/21/01 Adm. Date:  [id number] Disc. Date: [id number] Attending:  Dierdre Forth Pearline                           Operative Report  PREOPERATIVE DIAGNOSES:       1. Menorrhagia.                               2. Uterine fibroids.                               3. Anemia.  POSTOPERATIVE DIAGNOSES:      1. Menorrhagia.                               2. Uterine fibroids.                               3. Anemia.  OPERATION:                    Total abdominal hysterectomy.  SURGEON:                      Vanessa P. Pennie Rushing, M.D.  FIRST ASSISTANT:              Henreitta Leber, P.A.-C.  ANESTHESIA:                   General orotracheal.  ESTIMATED BLOOD LOSS:         750 cc.  COMPLICATIONS:                None.  FINDINGS:                     The uterus was enlarged with a large, central, posterior myoma.  The entire uterus was approximately 14-16 weeks size.  The ovaries and tubes were normal bilaterally.  DESCRIPTION OF PROCEDURE:     The patient was taken to the operating room after appropriate identified and placed on the operating table.  After attainiNG adequate general anesthesia, with the patient in the supine position, the abdomen, perineum, and vagina were prepped with multiple layers of Betadine.  A Foley catheter was inserted into the bladder and connected to straight drainage.  The abdomen was draped as a sterile field.  A transverse incision was made in the abdomen, and the abdomen opened in layers.  The peritoneum was entered and pelvic washings obtained which were subsequently discarded.  Examination of the pelvis and upper abdomen revealed the fibroid uterus to be the only abnormality.  A self-retaining OConnor-OSullivan retractor was placed in the incision and the bowel packed cephalad.  Kelly clamps were placed at each cornual region, and the  left round ligament was suture ligated, then incised, and that incision taken to the anterior leaf of the broad ligament.  The uteroovarian ligament was identified, clamped, cut, tied with a free-tie, and suture ligated.  Similar procedure was carried out on the opposite side with the round ligament and the uteroovarian  ligament. The bladder was bluntly dissected off the anterior cervix with a sponge stick, and the right and left uterine arteries skeletonized, clamped, cut, and suture ligated.  The parametrial tissues were clamped, cut, and suture ligated, and the uterine fundus excised from the cervix and removed from the operative field.  The paracervical tissues were then clamped, cut, and suture ligated. The uterosacral ligaments on either side were clamped, cut, and suture ligated and those sutures held.  The vaginal angles were then clamped, cut, and suture ligated and those sutures held.  The remainder of the uterine cervix was removed from the upper vagina sharply and the vaginal cuff closed with figure-of-eight sutures.  All sutures to this point were 0 Vicryl.  Copious irrigation was carried out and a hemostatic suture placed between the left uterosacral and vaginal angle sutures to achieve adequate hemostasis.  The vaginal angle on the left side was tied to the uterosacral on that side and a similar procedure carried out with the uterosacral and vaginal angle on the right side.  All instruments were then removed from the peritoneal cavity and the abdominal peritoneum closed with a running suture of 3-0 Vicryl.  The rectus muscles were copiously irrigated and made hemostatic with Bovie cautery.  The rectus fascia was closed with a running suture of 0 Vicryl from each apex to the midline.  The subcutaneous tissue was copiously irrigated and made hemostatic with Bovie cautery.  Skin staples were applied to the skin incision.  A sterile dressing was applied and the patient awakened  from general anesthesia and then taken to the recovery room in satisfactory condition having tolerated the procedure well with sponge and instrument counts correct.  SPECIMENS TO PATHOLOGY:       Uterine fundus and uterine cervix. DD:  01/21/01 TD:  01/27/01 Job: [id number]

## 2011-05-05 NOTE — Discharge Summary (Signed)
[facility]  Patient:    Jasmine Dickson, Jasmine Dickson                       MRN: [id number] Adm. Date:  [id number] Disc. Date: [id number] Attending:  Shaune Spittle                           Discharge Summary  DISCHARGE DIAGNOSES:          1. Symptomatic uterine fibroids.                               2. Dysmenorrhea.                               3. Menorrhagia.                               4. Anemia.  OPERATION:                    On January 21, 2001 the patient underwent a total abdominal hysterectomy.  HISTORY OF PRESENT ILLNESS:   Jasmine Dickson is a 50 year old, married, African-American female with non-insulin-dependent diabetes and hypertension, who is para 1-0-0-1 presenting with a history of uterine fibroids, anemia, increasingly severe dysmenorrhea with menorrhagia. The patient is being admitted for the definitive therapy of her symptoms through total abdominal hysterectomy. Please see patients dictated history and physical exam for details.  PHYSICAL EXAMINATION:  VITAL SIGNS:                  Blood pressure 118/70. Weight is 254 pounds. Height is 5 feet 4 inches tall.  GENERAL EXAM:                 Within normal limits; however, heart exam reveals a 2/6 systolic ejection murmur that radiates into the upper chest (asymptomatic).  ABDOMEN:                      Palpable firm mass arising from the pelvis midway between the symphysis pubis and the umbilicus. This is nontender, irregular, and there is no organomegaly.  PELVIC:                       EGBUS within normal limits. Vagina is rugose. Cervix without gross lesions. Uterus is approximately 18-week size, irregular and nontender. Adnexa is unable to be assessed secondary to the extension of the uterus into the adnexal region; however, there is no tenderness.  RECTOVAGINAL:                 A large amount of stool.  HOSPITAL COURSE:              On January 21, 2001 the patient underwent  a total abdominal hysterectomy, tolerating the procedure well. The patients postoperative course was marked by blood pressure elevation, the maximum of which was 206/101; however, the patient responded well to Labetalol IV and was normalized by postoperative day #1. Throughout patients hypertensive episode, she did not experience any neurologic sequelae. The patients postoperative hemoglobin was 9.1 (preoperative hemoglobin 11.9). The patient resumed bowel and bladder function by postoperative day #2 and was deemed ready for discharge to home.  DISCHARGE MEDICATIONS:  1. Tylox one to two tablets every four to six                                  hours as needed for pain.                               2. Ibuprofen 600 mg one tablet every six hours                                  with food for the next five days.                               3. Iron 325 mg one tablet twice daily for six                                  weeks.                               4. Stool softener 60 mg one tablet twice daily                                  until bowel movements are regular.                               5. Phenergan 25 mg one tablet four times as                                  needed for nausea.  FOLLOWUP:                     The patient is scheduled to present to [facility] on January 28, 2001 at 11:30 a.m. for staple removal and application of Steri-strips. She also is to follow up with Jasmine Dickson on March 04, 2001 at 10:30 a.m. for her six-week postoperative exam.  DISCHARGE INSTRUCTIONS:       The patient was given a copy of [facility] postoperative instruction sheet. She was further advised to avoid driving for two weeks, heavy lifting for four weeks, and intercourse for 6 weeks.  FINAL PATHOLOGY:              Not available of the time of discharge.   DD: 01/23/01 TD:  01/23/01 Job: [id number]

## 2011-05-05 NOTE — H&P (Signed)
[facility]  Patient:    Jasmine Dickson, Jasmine Dickson                         MRN: [id number] Attending:  Maris Berger. Pennie Rushing, M.D. Dictator:   Henreitta Leber, P.A.-C.                         History and Physical  DATE OF BIRTH:                [birth year]  HISTORY OF PRESENT ILLNESS:   Jasmine Dickson is a 50 year old, married, African-American female, with non-insulin dependent diabetes, hypertension, who is para 1-0-0-1, presenting with a history of uterine fibroids, anemia, increasingly severe dysmenorrhea, and menorrhagia.  The patients transvaginal ultrasound of November 21, 2000, revealed a 9.2 cm submucosal fibroid in the body and fundus of her uterus as well as a 2.2 cm anterior fibroid on the left.  The patients left ovary was not visualized transvaginally; however, it appeared normal on a transabdominal study.  After discussing the options of observation, Depot Lupron, myomectomy, hysterectomy, and uterine artery embolization, the patient has decided to undergo hysterectomy as definitive treatment for her symptoms since she wanted surgical sterilization under any circumstances.  OBSTETRICAL HISTORY:          Gravida 1, para 1-0-0-1.  GYNECOLOGICAL HISTORY:        Menarche 50 years old.  Periods occur monthly. The patient had a negative gonorrhea and Chlamydia culture December 2001.  She has no history of abnormal Pap smears.  Her last Pap smear in November 2001 was normal.  PAST SURGICAL HISTORY:        Negative.  Nor has patient had a blood transfusion.  MEDICAL HISTORY:              Positive for hypertension, non-insulin dependent diabetes, and anemia.  FAMILY HISTORY:               Positive for thyroid disease, hypertension, and migraines.  SOCIAL HISTORY:               The patient is married, and she is employed with [employer], community as a Building surveyor.  HABITS:                       The patient does not use alcohol or tobacco.  CURRENT  MEDICATIONS:          1. Zovia 135.                               2. Glucophage XR 500 mg 2 tablets daily.                               3. Altace 5 mg 1 tablet daily.                               5. HCTZ 25 mg 1 tablet daily.  ALLERGIES:                     No known drug allergies.  REVIEW OF SYSTEMS:            Review of systems are positive for headaches with menstrual periods, constipation, and as  previously described in HPI. Otherwise negative.  PHYSICAL EXAMINATION:  GENERAL:                      This is an obese, African-American female, in no acute distress.  VITAL SIGNS:                  Blood pressure 118/70.  Weight is 254 pounds. Height is 5 feet 4 inches tall.  ENT:                          Within normal limits.  NECK:                         Supple without masses.  There is no thyromegaly.  LUNGS:                        No wheezes, rales, or rhonchi.  HEART:                        Regular rate and rhythm.  The patient has a 2/6 systolic ejection murmur that radiates into the upper chest (asymptomatic).  ABDOMEN:                      Bowel sound are present.  It is soft.  The patient does, however, have a palpable, firm mass, arising from the pelvis midway between the symphysis pubis and umbilicus, and it is nontender, irregular.  There is no organomegaly.  PELVIC:                       EG/BUS is within normal limits.  Vagina is rugose.  Cervix without gross lesions.  Uterus 18 weeks size, irregular, and nontender.  Adnexa unable to assess secondary to extension of uterus into the adnexal regions; however, there is no tenderness.  Rectovaginal with large amount of stool.  IMPRESSION:                   1. Symptomatic uterine fibroids.                               2. Dysmenorrhea.                               3. Menorrhagia.                               4. History of anemia.  DISPOSITION:                  After consideration of options for management  of symptomatic uterine fibroids, the patient has consented for a total abdominal hysterectomy.  She further verbalized understanding of the risks associated with this procedure to include, but are not limited to, reaction to anesthesia, bleeding, infection, and damage to adjacent organs.  The patient is scheduled to undergo a total abdominal hysterectomy at [facility] in [place] on January 21, 2001, at 7:30 a.m. DD:  01/15/01 TD:  01/15/01 Job: [id number]

## 2011-05-07 ENCOUNTER — Other Ambulatory Visit: Payer: Self-pay | Admitting: Family Medicine

## 2011-05-07 DIAGNOSIS — I10 Essential (primary) hypertension: Secondary | ICD-10-CM

## 2011-05-08 NOTE — Telephone Encounter (Signed)
Refill request

## 2011-08-11 ENCOUNTER — Other Ambulatory Visit: Payer: Self-pay | Admitting: Family Medicine

## 2011-08-11 NOTE — Telephone Encounter (Signed)
Refill request

## 2011-09-19 ENCOUNTER — Encounter: Payer: Self-pay | Admitting: Family Medicine

## 2011-09-19 ENCOUNTER — Ambulatory Visit (INDEPENDENT_AMBULATORY_CARE_PROVIDER_SITE_OTHER): Payer: BC Managed Care – PPO | Admitting: Family Medicine

## 2011-09-19 VITALS — BP 130/80 | HR 81 | Wt 242.0 lb

## 2011-09-19 DIAGNOSIS — E785 Hyperlipidemia, unspecified: Secondary | ICD-10-CM

## 2011-09-19 DIAGNOSIS — B353 Tinea pedis: Secondary | ICD-10-CM

## 2011-09-19 DIAGNOSIS — E119 Type 2 diabetes mellitus without complications: Secondary | ICD-10-CM

## 2011-09-19 DIAGNOSIS — E040 Nontoxic diffuse goiter: Secondary | ICD-10-CM

## 2011-09-19 DIAGNOSIS — I10 Essential (primary) hypertension: Secondary | ICD-10-CM

## 2011-09-19 DIAGNOSIS — E669 Obesity, unspecified: Secondary | ICD-10-CM

## 2011-09-19 DIAGNOSIS — M549 Dorsalgia, unspecified: Secondary | ICD-10-CM

## 2011-09-19 HISTORY — DX: Dorsalgia, unspecified: M54.9

## 2011-09-19 HISTORY — DX: Tinea pedis: B35.3

## 2011-09-19 LAB — POCT SKIN KOH: Skin KOH, POC: NEGATIVE

## 2011-09-19 MED ORDER — DICLOFENAC SODIUM 75 MG PO TBEC: 75.0000 mg | DELAYED_RELEASE_TABLET | Freq: Two times a day (BID) | ORAL | Status: DC

## 2011-09-19 MED ORDER — MICONAZOLE NITRATE 2 % EX CREA: TOPICAL_CREAM | Freq: Two times a day (BID) | CUTANEOUS | Status: AC

## 2011-09-19 NOTE — Assessment & Plan Note (Signed)
Worsened control due to not consistently taking meds due to finances. She says she will do better.

## 2011-09-19 NOTE — Assessment & Plan Note (Signed)
Musculoskeletal. Recommended acetaminophen and short courses of Diclofenac if needed

## 2011-09-19 NOTE — Patient Instructions (Signed)
Call for assistance if your situation is getting risky  Take the Diclofenac for 1 week twice daily for the back pain. May take with Acetominophen  Buy miconazole over the counter to apply twice daily to feet to treat fungus infection. Use a week after it looks like its gone.  Return in 3 months for recheck of blood pressure and A1c

## 2011-09-19 NOTE — Assessment & Plan Note (Signed)
well controlled  
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
## 2011-09-19 NOTE — Assessment & Plan Note (Signed)
4.5 lb weight gain since last visit

## 2011-09-19 NOTE — Assessment & Plan Note (Signed)
KOH negative, but recommended topical miconazole

## 2011-11-14 ENCOUNTER — Telehealth: Payer: Self-pay | Admitting: Family Medicine

## 2011-11-14 DIAGNOSIS — E785 Hyperlipidemia, unspecified: Secondary | ICD-10-CM

## 2011-11-14 MED ORDER — METFORMIN HCL 1000 MG PO TABS: ORAL_TABLET | ORAL | Status: DC

## 2011-11-14 NOTE — Telephone Encounter (Signed)
She hadn't realized that she was to come in for fasting labs. I will order the Metformin and she promised to call to schedule lab tests, will change to direct LDL since it's difficult for her to come in fasting.

## 2011-11-14 NOTE — Telephone Encounter (Signed)
Pharmacy is telling her that they can't refill her Metformin until doctor see her.  She is wanting to know if enough can be called in to last until January [facility]

## 2011-11-14 NOTE — Telephone Encounter (Signed)
Will forward to Dr Hale 

## 2011-11-22 ENCOUNTER — Other Ambulatory Visit: Payer: BC Managed Care – PPO

## 2011-11-22 DIAGNOSIS — E785 Hyperlipidemia, unspecified: Secondary | ICD-10-CM

## 2011-11-22 DIAGNOSIS — E040 Nontoxic diffuse goiter: Secondary | ICD-10-CM

## 2011-11-22 DIAGNOSIS — I10 Essential (primary) hypertension: Secondary | ICD-10-CM

## 2011-11-22 LAB — COMPREHENSIVE METABOLIC PANEL
ALT: 15 U/L (ref 0–35)
AST: 14 U/L (ref 0–37)
Albumin: 4 g/dL (ref 3.5–5.2)
Alkaline Phosphatase: 56 U/L (ref 39–117)
Chloride: 100 mEq/L (ref 96–112)
Potassium: 4 mEq/L (ref 3.5–5.3)
Sodium: 139 mEq/L (ref 135–145)
Total Protein: 6.5 g/dL (ref 6.0–8.3)

## 2011-11-22 NOTE — Progress Notes (Signed)
CMP,D-LDL,TSH DONE TODAY Jasmine Dickson 

## 2011-12-01 ENCOUNTER — Other Ambulatory Visit: Payer: Self-pay | Admitting: Family Medicine

## 2011-12-01 ENCOUNTER — Encounter: Payer: Self-pay | Admitting: Family Medicine

## 2011-12-01 MED ORDER — SIMVASTATIN 40 MG PO TABS: 40.0000 mg | ORAL_TABLET | Freq: Every day | ORAL | Status: DC

## 2012-01-11 ENCOUNTER — Ambulatory Visit (INDEPENDENT_AMBULATORY_CARE_PROVIDER_SITE_OTHER): Payer: BC Managed Care – PPO | Admitting: Family Medicine

## 2012-01-11 ENCOUNTER — Encounter: Payer: Self-pay | Admitting: Family Medicine

## 2012-01-11 DIAGNOSIS — H60399 Other infective otitis externa, unspecified ear: Secondary | ICD-10-CM

## 2012-01-11 DIAGNOSIS — I10 Essential (primary) hypertension: Secondary | ICD-10-CM

## 2012-01-11 DIAGNOSIS — H6090 Unspecified otitis externa, unspecified ear: Secondary | ICD-10-CM | POA: Insufficient documentation

## 2012-01-11 DIAGNOSIS — J069 Acute upper respiratory infection, unspecified: Secondary | ICD-10-CM

## 2012-01-11 MED ORDER — NEOMYCIN-POLYMYXIN-HC 3.5-10000-1 OT SUSP: 3.0000 [drp] | Freq: Four times a day (QID) | OTIC | Status: AC

## 2012-01-11 NOTE — Patient Instructions (Signed)
Avoid decongestants (pseudoephedrine, phenylephrine) they can raise your  Blood pressure  Dont use qtips.  Try hydrogen peroxide or mineral oil to soften up the wax.   You can buy drops OTC such as debrox.  Use prescription drops for 1 week

## 2012-01-11 NOTE — Assessment & Plan Note (Signed)
Right otitis externa.  Ciprodex x 1 week.  Discussed stopping use of qtips and discussed proper care and ear hygiene.

## 2012-01-11 NOTE — Progress Notes (Signed)
  Subjective:    Patient ID: Jasmine Dickson, female    DOB: 08-16-1961, 51 y.o.   MRN: [id number]  HPI Same day appt for cold symptoms  URI:  Sent home from work [employer]).  3 days of right ear pain, rhinorrhea, sneezing, coughing, chest congestion.  Coughing not severe, not keeping her up.  No fever, dyspnea, diarrhea, or emesis.     Notes some itchiness of throat and sneezing.  Ear pain: Notes right ear pain.  No ear drainage, change in hearing.  HTN:  BP elevated, last time was well controlled.  Sates has not taken meds this morning, also taking decongestants OTC Review of Systemssee above     Objective:   Physical Exam GEN: Alert & Oriented, No acute distress HEENT: [place]/AT. EOMI, PERRLA, no conjunctival injection or scleral icterus.  Bilateral ear canals occluded with cerumen.  After irrigation, left TM wnl, right tm unable to unclog with curette.  TM appears to be non erythematous behind cerumen.  Some maceration of ear canal.  Mild discomfort when manipulated external ear. Nares without edema or rhinorrhea.  Oropharynx is without erythema or exudates.  No anterior or posterior cervical lymphadenopathy. CV:  Regular Rate & Rhythm, no murmur Respiratory:  Normal work of breathing, CTAB Abd:  + BS, soft, no tenderness to palpation       Assessment & Plan:

## 2012-01-11 NOTE — Assessment & Plan Note (Addendum)
Continued elevation despite recheck.  Was well controlled at last visit.  Advised to discontinue decongestants.  Has not taken BP meds yet this morning.  Has upcoming appt with PCP, will recheck at that time.

## 2012-01-11 NOTE — Assessment & Plan Note (Addendum)
Glenford Peers without signs of bacterial infection.  Advised may try some antihistamine for sneezing/itching component of cold.   Advised she may return to work tomorrow.  Given she works in a [employer], advised frequent handwashing, and face mask while stil coughing.

## 2012-01-23 ENCOUNTER — Ambulatory Visit: Payer: BC Managed Care – PPO | Admitting: Family Medicine

## 2012-02-06 ENCOUNTER — Ambulatory Visit (INDEPENDENT_AMBULATORY_CARE_PROVIDER_SITE_OTHER): Payer: BC Managed Care – PPO | Admitting: Family Medicine

## 2012-02-06 ENCOUNTER — Encounter: Payer: Self-pay | Admitting: Family Medicine

## 2012-02-06 VITALS — BP 124/84 | HR 90 | Temp 98.4°F | Ht 65.0 in | Wt 241.0 lb

## 2012-02-06 DIAGNOSIS — E119 Type 2 diabetes mellitus without complications: Secondary | ICD-10-CM

## 2012-02-06 DIAGNOSIS — L259 Unspecified contact dermatitis, unspecified cause: Secondary | ICD-10-CM

## 2012-02-06 DIAGNOSIS — N898 Other specified noninflammatory disorders of vagina: Secondary | ICD-10-CM

## 2012-02-06 DIAGNOSIS — E785 Hyperlipidemia, unspecified: Secondary | ICD-10-CM

## 2012-02-06 DIAGNOSIS — B353 Tinea pedis: Secondary | ICD-10-CM

## 2012-02-06 DIAGNOSIS — I10 Essential (primary) hypertension: Secondary | ICD-10-CM

## 2012-02-06 LAB — POCT GLYCOSYLATED HEMOGLOBIN (HGB A1C): Hemoglobin A1C: 7.6

## 2012-02-06 MED ORDER — SIMVASTATIN 40 MG PO TABS: 40.0000 mg | ORAL_TABLET | Freq: Every day | ORAL | Status: DC

## 2012-02-06 MED ORDER — PIOGLITAZONE HCL 15 MG PO TABS: 15.0000 mg | ORAL_TABLET | Freq: Every day | ORAL | Status: DC

## 2012-02-06 MED ORDER — METFORMIN HCL 1000 MG PO TABS: ORAL_TABLET | ORAL | Status: DC

## 2012-02-06 NOTE — Patient Instructions (Signed)
Call Dr Sheffield Slider with fasting blood sugar results in one month  Start Pioglitazone 15 mg one tablet daily. Call if you develop swelling or other side effects.   Please return to see Dr Sheffield Slider in 3 months.

## 2012-02-07 DIAGNOSIS — L259 Unspecified contact dermatitis, unspecified cause: Secondary | ICD-10-CM | POA: Insufficient documentation

## 2012-02-07 NOTE — Assessment & Plan Note (Signed)
Avoid soap. Hydrocortisone 1% tid for 1 - 2 weeks.

## 2012-02-07 NOTE — Assessment & Plan Note (Signed)
Unchanged. Encouraged weight loss 

## 2012-02-07 NOTE — Assessment & Plan Note (Signed)
well controlled  

## 2012-02-07 NOTE — Assessment & Plan Note (Signed)
No improvement in A1c, no contraindications to adding Actos.  Encouraged to further restrict calorie intake.

## 2012-02-07 NOTE — Assessment & Plan Note (Signed)
Asked her to restart Simvastatin. Do direct LDL next visit

## 2012-02-07 NOTE — Progress Notes (Signed)
  Subjective:    Patient ID: Jasmine Dickson, female    DOB: 01-10-1961, 51 y.o.   MRN: [id number]  HPIShe Continues her medications the same, but hasn't taken Diclofenac for some time.   She hasn't been testing her cbg's because her insurance won't cover the strips. Looking into places to get an eye exam so she can also get new glasses. Hasn't been able to diet, but is avoiding sugar sweetened drinks.   Past 2 weeks has had a mildly itchy rash on left lower face and anterior neck. Uses moisturizing soap that she hasn't changed.   No chest pain, shortness of breath, or ankle edema.Continues dry skin on her feet, but no sores. Good sensation.    Review of Systems     Objective:   Physical Exam  Constitutional:       Generalized obesity  Neck: Thyromegaly present.       Mildly enlarged thyroid  Cardiovascular: Normal rate and regular rhythm.   Pulmonary/Chest: Effort normal and breath sounds normal.  Skin:       Superficial patches of erythema lower left face and anterior neck  Superficial desquamation sores of feet with no interdigital changes. Little toenails are thickened.   Psychiatric: She has a normal mood and affect.          Assessment & Plan:

## 2012-04-21 ENCOUNTER — Other Ambulatory Visit: Payer: Self-pay | Admitting: Family Medicine

## 2012-05-17 ENCOUNTER — Other Ambulatory Visit: Payer: Self-pay | Admitting: Family Medicine

## 2012-05-17 NOTE — Telephone Encounter (Signed)
She is due for lab tests, A1c and follow up visit

## 2012-07-06 ENCOUNTER — Other Ambulatory Visit: Payer: Self-pay | Admitting: Family Medicine

## 2012-08-01 ENCOUNTER — Other Ambulatory Visit: Payer: Self-pay | Admitting: Family Medicine

## 2012-08-01 NOTE — Telephone Encounter (Signed)
Patient needs to be seen before further refills

## 2012-09-21 ENCOUNTER — Other Ambulatory Visit: Payer: Self-pay | Admitting: Family Medicine

## 2012-09-23 NOTE — Telephone Encounter (Signed)
Needs office visit before further refills

## 2012-09-28 ENCOUNTER — Other Ambulatory Visit: Payer: Self-pay | Admitting: Family Medicine

## 2012-09-29 NOTE — Telephone Encounter (Signed)
No more refills until seen

## 2012-11-05 ENCOUNTER — Ambulatory Visit (INDEPENDENT_AMBULATORY_CARE_PROVIDER_SITE_OTHER): Payer: BC Managed Care – PPO | Admitting: Family Medicine

## 2012-11-05 ENCOUNTER — Encounter: Payer: Self-pay | Admitting: Family Medicine

## 2012-11-05 VITALS — BP 129/79 | HR 76 | Temp 98.2°F | Ht 65.0 in | Wt 239.0 lb

## 2012-11-05 DIAGNOSIS — E040 Nontoxic diffuse goiter: Secondary | ICD-10-CM

## 2012-11-05 DIAGNOSIS — B353 Tinea pedis: Secondary | ICD-10-CM

## 2012-11-05 DIAGNOSIS — E785 Hyperlipidemia, unspecified: Secondary | ICD-10-CM

## 2012-11-05 DIAGNOSIS — R011 Cardiac murmur, unspecified: Secondary | ICD-10-CM

## 2012-11-05 DIAGNOSIS — E1165 Type 2 diabetes mellitus with hyperglycemia: Secondary | ICD-10-CM

## 2012-11-05 DIAGNOSIS — I10 Essential (primary) hypertension: Secondary | ICD-10-CM

## 2012-11-05 DIAGNOSIS — IMO0002 Reserved for concepts with insufficient information to code with codable children: Secondary | ICD-10-CM

## 2012-11-05 LAB — POCT GLYCOSYLATED HEMOGLOBIN (HGB A1C): Hemoglobin A1C: 6.7

## 2012-11-05 NOTE — Assessment & Plan Note (Signed)
Check TSH 

## 2012-11-05 NOTE — Patient Instructions (Addendum)
Athlete's Foot Athlete's foot (tinea pedis) is a fungal infection of the skin on the feet. It often occurs on the skin between the toes or underneath the toes. It can also occur on the soles of the feet. Athlete's foot is more likely to occur in hot, humid weather. Not washing your feet or changing your socks often enough can contribute to athlete's foot. The infection can spread from person to person (contagious). CAUSES Athlete's foot is caused by a fungus. This fungus thrives in warm, moist places. Most people get athlete's foot by sharing shower stalls, towels, and wet floors with an infected person. People with weakened immune systems, including those with diabetes, may be more likely to get athlete's foot. SYMPTOMS   Itchy areas between the toes or on the soles of the feet.  White, flaky, or scaly areas between the toes or on the soles of the feet.  Tiny, intensely itchy blisters between the toes or on the soles of the feet.  Tiny cuts on the skin. These cuts can develop a bacterial infection.  Thick or discolored toenails. DIAGNOSIS  Your caregiver can usually tell what the problem is by doing a physical exam. Your caregiver may also take a skin sample from the rash area. The skin sample may be examined under a microscope, or it may be tested to see if fungus will grow in the sample. A sample may also be taken from your toenail for testing. TREATMENT  Over-the-counter and prescription medicines can be used to kill the fungus. These medicines are available as powders or creams. Your caregiver can suggest medicines for you. Fungal infections respond slowly to treatment. You may need to continue using your medicine for several weeks. Clotrimazole or Miconazole creams twice for 3 weeks. PREVENTION   Do not share towels.  Wear sandals in wet areas, such as shared locker rooms and shared showers.  Keep your feet dry. Wear shoes that allow air to circulate. Wear cotton or wool socks. HOME  CARE INSTRUCTIONS   Take medicines as directed by your caregiver. Do not use steroid creams on athlete's foot.  Keep your feet clean and cool. Wash your feet daily and dry them thoroughly, especially between your toes.  Change your socks every day. Wear cotton or wool socks. In hot climates, you may need to change your socks 2 to 3 times per day.  Wear sandals or canvas tennis shoes with good air circulation.  If you have blisters, soak your feet in Burow's solution or Epsom salts for 20 to 30 minutes, 2 times a day to dry out the blisters. Make sure you dry your feet thoroughly afterward. SEEK MEDICAL CARE IF:   You have a fever.  You have swelling, soreness, warmth, or redness in your foot.  You are not getting better after 7 days of treatment.  You are not completely cured after 30 days.  You have any problems caused by your medicines. MAKE SURE YOU:   Understand these instructions.  Will watch your condition.  Will get help right away if you are not doing well or get worse. Document Released: 12/01/2000 Document Revised: 02/26/2012 Document Reviewed: 09/22/2011 [facility] Patient Information 2013 [place], [place].   I will send you your lab results  Please return to see Dr Sheffield Slider in 6 months.

## 2012-11-05 NOTE — Assessment & Plan Note (Signed)
Clinically tinea.

## 2012-11-05 NOTE — Assessment & Plan Note (Signed)
well controlled  

## 2012-11-05 NOTE — Assessment & Plan Note (Signed)
Check LDL. 

## 2012-11-05 NOTE — Assessment & Plan Note (Signed)
Improved control on the pioglitazone

## 2012-11-05 NOTE — Progress Notes (Signed)
  Subjective:    Patient ID: Jasmine Dickson, female    DOB: Sep 22, 1961, 51 y.o.   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   Her hours have been cut back at [employer] so that they are all losing health coverage so she is trying to sign up through [employer]  She hasn't been able to check her capilllary blood glucoses due to no lancets though she has a machine and strips. No hypoglycemic episodes.  Review of Systems     Objective:   Physical Exam  Neck: Thyromegaly present.       Mildly enlarged symmetrically  Cardiovascular: Normal rate and regular rhythm.   No murmur heard. Pulmonary/Chest: Effort normal and breath sounds normal. She has no rales.  Skin:       White skin discoloration with superficial fissuring.   Psychiatric: She has a normal mood and affect. Her behavior is normal. Thought content normal.          Assessment & Plan:

## 2012-11-09 ENCOUNTER — Other Ambulatory Visit: Payer: Self-pay | Admitting: Family Medicine

## 2012-11-11 ENCOUNTER — Telehealth: Payer: Self-pay | Admitting: *Deleted

## 2012-11-11 NOTE — Telephone Encounter (Signed)
Message copied by Jennette Bill on Mon Nov 11, 2012 10:05 AM ------      Message from: Zachery Dauer      Created: Mon Nov 11, 2012  9:39 AM      Regarding: Labs from last visit       Looks like her labs weren't drawn after her visit. Does she know to come back for them?

## 2012-11-11 NOTE — Telephone Encounter (Signed)
Attempted to call both work and cell numbers and unable to reach patient or leave message. Will try again later. She needs to schedule a lab visit to come back for labs, were unable to draw them at her office visit due to lab being closed.Jasmine Dickson, Rodena Medin

## 2012-11-18 ENCOUNTER — Other Ambulatory Visit: Payer: Self-pay | Admitting: Family Medicine

## 2012-11-18 NOTE — Telephone Encounter (Signed)
Called pt several times. Number busy.( Pt needs lab appt). Will try again later. Lorenda Hatchet, Renato Battles

## 2012-11-19 NOTE — Telephone Encounter (Signed)
Tried several times to reach pt. Unable to reach. Lorenda Hatchet, Renato Battles

## 2012-12-21 ENCOUNTER — Other Ambulatory Visit: Payer: Self-pay | Admitting: Family Medicine

## 2012-12-24 ENCOUNTER — Other Ambulatory Visit: Payer: Self-pay | Admitting: Family Medicine

## 2012-12-24 DIAGNOSIS — E119 Type 2 diabetes mellitus without complications: Secondary | ICD-10-CM

## 2012-12-24 MED ORDER — SIMVASTATIN 40 MG PO TABS: 40.0000 mg | ORAL_TABLET | Freq: Every day | ORAL | Status: DC

## 2012-12-24 MED ORDER — PIOGLITAZONE HCL 15 MG PO TABS: 15.0000 mg | ORAL_TABLET | Freq: Every day | ORAL | Status: DC

## 2012-12-24 MED ORDER — BENAZEPRIL-HYDROCHLOROTHIAZIDE 20-12.5 MG PO TABS: 1.0000 | ORAL_TABLET | Freq: Two times a day (BID) | ORAL | Status: DC

## 2012-12-24 NOTE — Telephone Encounter (Signed)
I spoke with her and she will come in next week for the lab tests. She was told that the Benazepril Rx wasn't sent in, so I will send again. All for one month.

## 2012-12-24 NOTE — Telephone Encounter (Signed)
Patient is calling because she needs refills on Benazapril, Actos, Zocor and [facility] [address].  She is out of most of her meds and needs refills today please.

## 2012-12-24 NOTE — Telephone Encounter (Signed)
Will fwd. To PCP for refills. .Marshall Roehrich  

## 2012-12-31 ENCOUNTER — Other Ambulatory Visit: Payer: Self-pay

## 2012-12-31 DIAGNOSIS — E785 Hyperlipidemia, unspecified: Secondary | ICD-10-CM

## 2012-12-31 LAB — COMPREHENSIVE METABOLIC PANEL
ALT: 9 U/L (ref 0–35)
AST: 13 U/L (ref 0–37)
Alkaline Phosphatase: 47 U/L (ref 39–117)
BUN: 12 mg/dL (ref 6–23)
Chloride: 102 mEq/L (ref 96–112)
Creat: 0.64 mg/dL (ref 0.50–1.10)
Total Bilirubin: 0.8 mg/dL (ref 0.3–1.2)

## 2012-12-31 LAB — LDL CHOLESTEROL, DIRECT: Direct LDL: 112 mg/dL — ABNORMAL HIGH

## 2012-12-31 NOTE — Progress Notes (Signed)
CMP AND D LDL DONE TODAY Jasmine Dickson

## 2013-01-01 ENCOUNTER — Encounter: Payer: Self-pay | Admitting: Family Medicine

## 2013-03-29 ENCOUNTER — Other Ambulatory Visit: Payer: Self-pay | Admitting: Family Medicine

## 2013-05-06 ENCOUNTER — Encounter: Payer: Self-pay | Admitting: Family Medicine

## 2013-05-06 ENCOUNTER — Ambulatory Visit (INDEPENDENT_AMBULATORY_CARE_PROVIDER_SITE_OTHER): Payer: BC Managed Care – PPO | Admitting: Family Medicine

## 2013-05-06 VITALS — BP 138/89 | HR 71 | Temp 98.1°F | Ht 65.0 in | Wt 234.0 lb

## 2013-05-06 DIAGNOSIS — W57XXXA Bitten or stung by nonvenomous insect and other nonvenomous arthropods, initial encounter: Secondary | ICD-10-CM | POA: Insufficient documentation

## 2013-05-06 NOTE — Assessment & Plan Note (Signed)
Appropriate local reaction without signs of systemic reaction.  No local infection appreciated.  Advised symptomatic treatment with antihistamines, topical anti-itch OTC gels/creams PRN, and cool wash clothes.  Red flags for return, including infection, discussed. Pt would like to return to work today so note given stating she is ok to work.

## 2013-05-06 NOTE — Patient Instructions (Addendum)
It was nice to meet you today.  You definitely got bit by a bug!  To help with the itching, you can pick up an antihistamine/allergy medicine such as Claritin or Zyrtec (get the generic, it will be cheaper). Use cool wash cloths to help with the itching. You can also get some anti-itch/anti-bite creams/gels that are over the counter-- the Benadryl one helps a lot.  You can try using your hydrocortisone cream if you want.  Come back if you notice spreading redness on your neck/shoulder or if you start having fevers or a lot of pain at that are.  Otherwise, come back as needed.    Insect Bite Mosquitoes, flies, fleas, bedbugs, and many other insects can bite. Insect bites are different from insect stings. A sting is when venom is injected into the skin. Some insect bites can transmit infectious diseases. SYMPTOMS  Insect bites usually turn red, swell, and itch for 2 to 4 days. They often go away on their own. TREATMENT  Your caregiver may prescribe antibiotic medicines if a bacterial infection develops in the bite. HOME CARE INSTRUCTIONS  Do not scratch the bite area.  Keep the bite area clean and dry. Wash the bite area thoroughly with soap and water.  Put ice or cool compresses on the bite area.  Put ice in a plastic bag.  Place a towel between your skin and the bag.  Leave the ice on for 20 minutes, 4 times a day for the first 2 to 3 days, or as directed.  You may apply a baking soda paste, cortisone cream, or calamine lotion to the bite area as directed by your caregiver. This can help reduce itching and swelling.  Only take over-the-counter or prescription medicines as directed by your caregiver.  If you are given antibiotics, take them as directed. Finish them even if you start to feel better. You may need a tetanus shot if:  You cannot remember when you had your last tetanus shot.  You have never had a tetanus shot.  The injury broke your skin. If you get a tetanus  shot, your arm may swell, get red, and feel warm to the touch. This is common and not a problem. If you need a tetanus shot and you choose not to have one, there is a rare chance of getting tetanus. Sickness from tetanus can be serious. SEEK IMMEDIATE MEDICAL CARE IF:   You have increased pain, redness, or swelling in the bite area.  You see a red line on the skin coming from the bite.  You have a fever.  You have joint pain.  You have a headache or neck pain.  You have unusual weakness.  You have a rash.  You have chest pain or shortness of breath.  You have abdominal pain, nausea, or vomiting.  You feel unusually tired or sleepy. MAKE SURE YOU:   Understand these instructions.  Will watch your condition.  Will get help right away if you are not doing well or get worse. Document Released: 01/11/2005 Document Revised: 02/26/2012 Document Reviewed: 07/05/2011 [facility] Patient Information 2013 [place], [place].

## 2013-05-06 NOTE — Progress Notes (Signed)
S: Pt comes in today for SDA for bug bite.  She reports being bite on her R shoulder earlier this morning multiple times by the same bug (it was caught in her shirt/collar). She noticed the bites around 8am and a co-worker was able to get the bug out of her shirt.  She does have DM, which is why she wanted to come in and be seen.  No pain at the area, just itching.  No rash on her face, no SOB, difficulty swallowing or throat/mouth itching/swelling.  She put some neosporin spray on it and the cool feel made the itching a little better.  No fevers/chills.  No spreading redness.    ROS: Per HPI  History  Smoking status  . Never Smoker   Smokeless tobacco  . Not on file    O:  Filed Vitals:   05/06/13 1037  BP: 138/89  Pulse: 71  Temp: 98.1 F (36.7 C)    Gen: NAD HEENT: MMM, no facial rash or edema, pharynx clear CV: RRR, no murmur Pulm: CTA bilat, no wheezes or crackles Skin: multiple (>10) mildly erythematous, raised areas over right anterior shoulder/base of R neck (37mm-2cm) c/w non-infected insect bite; no areas of fluctuance, no drainage, no spreading erythema    A/P: 52 y.o. female p/w multiple insect bites -See problem list -f/u in PRN

## 2013-06-06 ENCOUNTER — Other Ambulatory Visit: Payer: Self-pay | Admitting: Family Medicine

## 2013-06-09 ENCOUNTER — Other Ambulatory Visit: Payer: Self-pay | Admitting: *Deleted

## 2013-06-09 DIAGNOSIS — E119 Type 2 diabetes mellitus without complications: Secondary | ICD-10-CM

## 2013-06-09 MED ORDER — BENAZEPRIL-HYDROCHLOROTHIAZIDE 20-12.5 MG PO TABS: 1.0000 | ORAL_TABLET | Freq: Every day | ORAL | Status: DC

## 2013-06-09 MED ORDER — PIOGLITAZONE HCL 15 MG PO TABS: 15.0000 mg | ORAL_TABLET | Freq: Every day | ORAL | Status: DC

## 2013-06-09 NOTE — Telephone Encounter (Signed)
No further refills until she is seen and has lab tests

## 2013-06-26 ENCOUNTER — Other Ambulatory Visit: Payer: Self-pay

## 2013-07-02 ENCOUNTER — Telehealth: Payer: Self-pay | Admitting: Family Medicine

## 2013-07-02 NOTE — Telephone Encounter (Signed)
Pt is requesting a refill on Benazepril, merformin, and [facility]azone [facility] [address]. JW

## 2013-07-03 NOTE — Telephone Encounter (Signed)
Will fwd to Md.  Raeshawn Vo L, CMA  

## 2013-07-04 NOTE — Telephone Encounter (Signed)
She is overdue for her A1c and follow up. If she schedules an appointment I will refill for enough to get her to that appointment.   She should be asking for refills sent through her pharmacy.   Every 6 month follow up is standard for diabetics and they should be told this without messaging the physician, because It seems like it is a request to do them the favor of giving them poor care.

## 2013-07-11 ENCOUNTER — Other Ambulatory Visit: Payer: Self-pay | Admitting: Family Medicine

## 2013-07-11 DIAGNOSIS — E119 Type 2 diabetes mellitus without complications: Secondary | ICD-10-CM

## 2013-07-11 MED ORDER — PIOGLITAZONE HCL 15 MG PO TABS: 15.0000 mg | ORAL_TABLET | Freq: Every day | ORAL | Status: DC

## 2013-07-11 MED ORDER — METFORMIN HCL 1000 MG PO TABS: ORAL_TABLET | ORAL | Status: DC

## 2013-07-11 MED ORDER — BENAZEPRIL-HYDROCHLOROTHIAZIDE 20-12.5 MG PO TABS: 1.0000 | ORAL_TABLET | Freq: Every day | ORAL | Status: DC

## 2013-08-05 ENCOUNTER — Ambulatory Visit (INDEPENDENT_AMBULATORY_CARE_PROVIDER_SITE_OTHER): Payer: Self-pay | Admitting: Family Medicine

## 2013-08-05 VITALS — BP 202/106 | HR 73 | Ht 65.0 in | Wt 244.0 lb

## 2013-08-05 DIAGNOSIS — I10 Essential (primary) hypertension: Secondary | ICD-10-CM

## 2013-08-05 DIAGNOSIS — E1165 Type 2 diabetes mellitus with hyperglycemia: Secondary | ICD-10-CM

## 2013-08-05 DIAGNOSIS — Z598 Other problems related to housing and economic circumstances: Secondary | ICD-10-CM

## 2013-08-05 NOTE — Assessment & Plan Note (Signed)
Admits to eating more due to stress

## 2013-08-05 NOTE — Assessment & Plan Note (Signed)
Surprisingly well-controlled off meds one month. Will stop Pioglitazone and call [facility] about least expensive Metformin.

## 2013-08-05 NOTE — Assessment & Plan Note (Addendum)
Very elevated off medication. Will [facility] about [facility] alternative.   She says she can get 90 days worth for 12 dollars, so will change to 180 tablets to take it twice daily and have her return for a BMET

## 2013-08-05 NOTE — Assessment & Plan Note (Signed)
[employer] is being sued and may be reinstating an Licensed conveyancer.

## 2013-08-05 NOTE — Progress Notes (Addendum)
  Subjective:    Patient ID: Jasmine Dickson, female    DOB: 1961/08/25, 52 y.o.   MRN: [id number]  Diabetes  Hypertension   Diabetes mellitus - she can't afford the Pioglitazone, for the last month also her Metformin. Hasn't been checking her sugars.   Hypertension - She realizes that her blood pressure is elevated since she's been off of her Benazepril Previously had been taking it twice daily without problems  Obesity - She's been eating more due to the stress.    Review of Systems     Objective:   Physical Exam  Constitutional:  Morbid obesity  Cardiovascular: Normal rate and regular rhythm.   Pulmonary/Chest: Effort normal and breath sounds normal.  Musculoskeletal: She exhibits no edema.  Neurological: She is alert.          Assessment & Plan:

## 2013-08-05 NOTE — Patient Instructions (Addendum)
I will call you tomorrow afternoon with your new medicine plan.  Cell [phone]  Please return to see Dr Randolm Idol in 1 month  I will be retiring in the summer of this year. Thank you for allowing me to work with you in maintaining your health. Donnella Sham, MD will be my replacement beginning the second week in August. He is moving to [place] having completed his family medicine residency training in [place], [place]. Please let me know if you would like to be assigned to a different physician.

## 2013-08-07 ENCOUNTER — Telehealth: Payer: Self-pay | Admitting: Family Medicine

## 2013-08-07 NOTE — Telephone Encounter (Signed)
Please change refills to 90 day supply if possible - benazepril is only once daily per [facility] has this changed? Please advise. Wyatt Haste, RN-BSN

## 2013-08-07 NOTE — Telephone Encounter (Signed)
Pt needs to have 90 day refill for [facility]. Also benazepril needs be written as twice a day not once a day. She would like this to be fixed. JW

## 2013-08-12 MED ORDER — BENAZEPRIL-HYDROCHLOROTHIAZIDE 20-12.5 MG PO TABS: 1.0000 | ORAL_TABLET | Freq: Two times a day (BID) | ORAL | Status: DC

## 2013-08-12 NOTE — Addendum Note (Signed)
Addended by: Zachery Dauer on: 08/12/2013 10:06 AM   Modules accepted: Orders

## 2013-09-10 ENCOUNTER — Other Ambulatory Visit: Payer: Self-pay | Admitting: Family Medicine

## 2013-09-10 MED ORDER — METFORMIN HCL 1000 MG PO TABS: ORAL_TABLET | ORAL | Status: DC

## 2014-01-25 ENCOUNTER — Other Ambulatory Visit: Payer: Self-pay | Admitting: Family Medicine

## 2014-03-11 ENCOUNTER — Other Ambulatory Visit: Payer: Self-pay | Admitting: Family Medicine

## 2014-03-13 NOTE — Telephone Encounter (Signed)
LMVM for patient to call office.  Please have patient schedule appt with PCP before any meds refills.  Thank you.  Jasmine Dickson, [name], [name]

## 2014-03-13 NOTE — Telephone Encounter (Signed)
RN staff - please call patient to set up appointment for follow up of blood pressure, I will send in a refill of her medication but please let her know that I can not fill this medication again without seeing her first in office.

## 2014-04-10 ENCOUNTER — Encounter: Payer: Self-pay | Admitting: Family Medicine

## 2014-04-10 ENCOUNTER — Ambulatory Visit (INDEPENDENT_AMBULATORY_CARE_PROVIDER_SITE_OTHER): Payer: Self-pay | Admitting: Family Medicine

## 2014-04-10 VITALS — BP 134/86 | HR 84 | Ht 65.0 in | Wt 235.0 lb

## 2014-04-10 DIAGNOSIS — E1165 Type 2 diabetes mellitus with hyperglycemia: Secondary | ICD-10-CM

## 2014-04-10 DIAGNOSIS — IMO0002 Reserved for concepts with insufficient information to code with codable children: Secondary | ICD-10-CM

## 2014-04-10 DIAGNOSIS — E785 Hyperlipidemia, unspecified: Secondary | ICD-10-CM

## 2014-04-10 DIAGNOSIS — I10 Essential (primary) hypertension: Secondary | ICD-10-CM

## 2014-04-10 DIAGNOSIS — E66813 Obesity, class 3: Secondary | ICD-10-CM

## 2014-04-10 DIAGNOSIS — IMO0001 Reserved for inherently not codable concepts without codable children: Secondary | ICD-10-CM

## 2014-04-10 LAB — POCT GLYCOSYLATED HEMOGLOBIN (HGB A1C): HEMOGLOBIN A1C: 6.9

## 2014-04-10 NOTE — Patient Instructions (Addendum)
Diabetes - stable, continue metformin at current dose, please work to increase exercise to at least 3 times per weeks, attempt to replace morning meal with Oatmeal/yogurt/fruit.  High Blood Pressure - improved today, continue current blood pressure medication, avoid salt in your diet  Weight - attempt to lose 3-5 pounds by our next visit together  Please schedule yearly physical/GYN exam.   Please make a separate appointment to have lab work completed early in the morning (please do not eat prior to the lab work).

## 2014-04-11 NOTE — Assessment & Plan Note (Addendum)
Well controlled. -continue current regimen -check BMP

## 2014-04-11 NOTE — Assessment & Plan Note (Signed)
Stable on current regimen -due to eye and foot exam, will discuss at next visit -continue current dose of metformin -discussed diet and exercise

## 2014-04-11 NOTE — Assessment & Plan Note (Signed)
Patient remains obese. -counseled patient on importance of regular exercise and eliminating sugar beverages from her diet, also discussed limiting carbohydrate intake -monitor at subsequent visits

## 2014-04-11 NOTE — Progress Notes (Signed)
   Subjective:    Patient ID: Jasmine Dickson, female    DOB: February 10, 1961, 54 y.o.   MRN: [id number]  HPI 53 y/o female presents for follow up of multiple medical issues.  HTN - currently on Benazepril-HCTZ 20-12.5 daily, tolerating well, to chest pain, no headaches, no vision changes  T2DM - currently on Metformin, tolerating well, occasional diarrhea, does not check blood glucose regularly, due for recheck of A1C  Obesity - patient only exercises once weekly, would like to start walking more once the weather warms up, breakfast typically consists of a egg Mcmuffin from [employer], she eats lunch and dinner at [employer] where she works, drinks one 20 oz regular soda per day, few fruits and vegetables  Social - never a smoker, works at [employer]  Constitutional: Negative for fever, chills and fatigue.  Respiratory: Negative for chest tightness and shortness of breath.   Cardiovascular: Negative for chest pain.  Gastrointestinal: Negative for nausea, vomiting and diarrhea.       Objective:   Physical Exam  Constitutional: She is oriented to person, place, and time.  HENT:  Head: Normocephalic.  Eyes: Conjunctivae and EOM are normal. Pupils are equal, round, and reactive to light.  Cardiovascular: Normal rate, regular rhythm and normal heart sounds.   No murmur heard. Pulmonary/Chest: Effort normal and breath sounds normal.  Abdominal: Soft. Bowel sounds are normal.  Neurological: She is alert and oriented to person, place, and time.   Vitals: reviewed, Weight 235 lbs. Gen: pleasant AAF, NAD  Hemoglobin A1C 6.9     Assessment & Plan:  Please see problem specific assessment and plan.

## 2014-05-07 ENCOUNTER — Other Ambulatory Visit: Payer: Self-pay

## 2014-05-12 ENCOUNTER — Encounter: Payer: Self-pay | Admitting: Family Medicine

## 2014-05-14 ENCOUNTER — Other Ambulatory Visit: Payer: PRIVATE HEALTH INSURANCE

## 2014-05-14 DIAGNOSIS — I10 Essential (primary) hypertension: Secondary | ICD-10-CM

## 2014-05-14 DIAGNOSIS — E785 Hyperlipidemia, unspecified: Secondary | ICD-10-CM

## 2014-05-14 LAB — LIPID PANEL
CHOL/HDL RATIO: 3 ratio
CHOLESTEROL: 186 mg/dL (ref 0–200)
HDL: 63 mg/dL (ref >39)
LDL Cholesterol: 113 mg/dL — ABNORMAL HIGH (ref 0–99)
Triglycerides: 50 mg/dL (ref <150)
VLDL: 10 mg/dL (ref 0–40)

## 2014-05-14 LAB — CMP AND LIVER
ALBUMIN: 3.8 g/dL (ref 3.5–5.2)
ALK PHOS: 54 U/L (ref 39–117)
ALT: 11 U/L (ref 0–35)
AST: 13 U/L (ref 0–37)
BILIRUBIN DIRECT: 0.1 mg/dL (ref 0.0–0.3)
BUN: 13 mg/dL (ref 6–23)
CO2: 26 mEq/L (ref 19–32)
Calcium: 9.7 mg/dL (ref 8.4–10.5)
Chloride: 100 mEq/L (ref 96–112)
Creat: 0.68 mg/dL (ref 0.50–1.10)
GLUCOSE: 122 mg/dL — AB (ref 70–99)
Indirect Bilirubin: 0.6 mg/dL (ref 0.2–1.2)
POTASSIUM: 4.2 meq/L (ref 3.5–5.3)
SODIUM: 134 meq/L — AB (ref 135–145)
TOTAL PROTEIN: 6.5 g/dL (ref 6.0–8.3)
Total Bilirubin: 0.7 mg/dL (ref 0.2–1.2)

## 2014-05-14 LAB — CBC
HCT: 35.2 % — ABNORMAL LOW (ref 36.0–46.0)
Hemoglobin: 12.2 g/dL (ref 12.0–15.0)
MCH: 30.3 pg (ref 26.0–34.0)
MCHC: 34.7 g/dL (ref 30.0–36.0)
MCV: 87.6 fL (ref 78.0–100.0)
Platelets: 370 10*3/uL (ref 150–400)
RBC: 4.02 MIL/uL (ref 3.87–5.11)
RDW: 12.7 % (ref 11.5–15.5)
WBC: 7 10*3/uL (ref 4.0–10.5)

## 2014-05-14 NOTE — Progress Notes (Signed)
CMP,LIVER,CBC AND FLP DONE TODAY Jasmine Dickson

## 2014-05-15 ENCOUNTER — Telehealth: Payer: Self-pay | Admitting: Family Medicine

## 2014-05-15 DIAGNOSIS — E785 Hyperlipidemia, unspecified: Secondary | ICD-10-CM

## 2014-05-15 MED ORDER — SIMVASTATIN 40 MG PO TABS: 40.0000 mg | ORAL_TABLET | Freq: Every day | ORAL | Status: DC

## 2014-05-15 NOTE — Telephone Encounter (Signed)
Discussed lab work. ASCVD risk score of 8.1%. Will start Simvastatin 40 mg daily. Patient scheduled for preventative visit next week.

## 2014-05-15 NOTE — Assessment & Plan Note (Signed)
Start Simvastatin 40 mg as ASCVD risk score of 8.1%.

## 2014-05-19 ENCOUNTER — Encounter: Payer: Self-pay | Admitting: Family Medicine

## 2014-05-19 ENCOUNTER — Ambulatory Visit (INDEPENDENT_AMBULATORY_CARE_PROVIDER_SITE_OTHER): Payer: PRIVATE HEALTH INSURANCE | Admitting: Family Medicine

## 2014-05-19 VITALS — BP 130/83 | HR 86 | Ht 65.0 in | Wt 234.0 lb

## 2014-05-19 DIAGNOSIS — E040 Nontoxic diffuse goiter: Secondary | ICD-10-CM

## 2014-05-19 DIAGNOSIS — N6089 Other benign mammary dysplasias of unspecified breast: Secondary | ICD-10-CM

## 2014-05-19 DIAGNOSIS — Z0000 Encounter for general adult medical examination without abnormal findings: Secondary | ICD-10-CM

## 2014-05-19 NOTE — Patient Instructions (Addendum)
  You are up to date on immunizations.   Please call your insurance to see which physician can perform you colonoscopy (colon cancer screening).  You are due for an eye exam - I recommend Dr. Katy Fitch (please let me know if you need a referral).   Please review the documentation regarding healthcare power of attorney/living will.  Please schedule your mammogram.

## 2014-05-20 ENCOUNTER — Other Ambulatory Visit: Payer: Self-pay

## 2014-05-20 DIAGNOSIS — N6089 Other benign mammary dysplasias of unspecified breast: Secondary | ICD-10-CM

## 2014-05-20 DIAGNOSIS — Z0000 Encounter for general adult medical examination without abnormal findings: Principal | ICD-10-CM

## 2014-05-20 DIAGNOSIS — Z1231 Encounter for screening mammogram for malignant neoplasm of breast: Secondary | ICD-10-CM

## 2014-05-20 HISTORY — DX: Other benign mammary dysplasias of unspecified breast: N60.89

## 2014-05-20 NOTE — Assessment & Plan Note (Addendum)
Multiple on anterior chest/breast ranging in diameter of 0.5 - 1.0 cm -as not bothering the patient no plan for intervention at this time

## 2014-05-20 NOTE — Assessment & Plan Note (Addendum)
TSH wnl 2012 (has been stable) -monitor clinically.

## 2014-05-20 NOTE — Assessment & Plan Note (Signed)
Patient presents for annual health exam. -Up to date on immunizations -Due for mammogram and Colonoscopy (patient to schedule) -Not a candidate for PAP Smear and she is s/p hysterectomy -Information of POA/Living Will provided -Counseled on diet and exercise -Return in one year for preventative exam.

## 2014-05-20 NOTE — Progress Notes (Signed)
Patient ID: Jasmine Dickson, female   DOB: [birth year], 53 y.o.   MRN: [id number] 53 y.o. year old female presents for well woman/preventative visit and annual GYN examination.  Acute Concerns: No acute concerns identified today.   Diet: Patient has significantly decreased her fast foot intake, now only eating 1-2 times per week, she eats lunch and dinner at work [employer]), she eats a tomato sandwich for breakfast most mornings  Exercise: walking only 1-2 times per week, she would like to increase  Sexual/Birth History: not currently sexually active, has one child (female 52)  Birth Control: previous hysterectomy  POA/Living Will: does not have one in place  Social:  History   Social History  . Marital Status: Married    Spouse Name: N/A    Number of Children: 1  . Years of Education: 12   Occupational History  .     Social History Main Topics  . Smoking status: Never Smoker   . Smokeless tobacco: None  . Alcohol Use: 3.0 oz/week    3 Cans of beer, 2 Shots of liquor per week  . Drug Use: No  . Sexual Activity: No   Other Topics Concern  . 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[employer]   One child     Immunization:  Tdap/TD: 2007  Influenza: 2013  Pneumococcal: 2013  Herpes Zoster: Not a candidate  Cancer Screening:  Pap Smear: s/p Hysterectomy  Mammogram: 2011  Colonoscopy: Has never had completed  Dexa: Is not a candidate  Physical Exam: VITALS: reviewed EGB:TDVVOHYW AAF, obese, NAD HEENT:normocephalic, bilateral TM pearly grey, PERRL, EOMI, no scleral icterus, nasal septum midline, MMM, uvula midline, no pharyngeal erythema or exudate noted, neck supple, goiter present without frank nodule CARDIAC: RRR, S1 and S2 present, no murmurs, no heaves/thrills RESP: CTAB, normal effort BREAST:Exam performed in the presence of a chaperone. Fibrocystic breasts, no abnormal masses appreciated, no nipple drainage, no axillary  lymphadenopathy ABD: obese, soft, no tenderness, normal bowel sounds GU/GYN:Exam performed in the presence of a chaperone. Normal external female genitalia, bimanual exam identified no gross mass, cervix/uterus not present EXT: No edema, 2+ radial pulses SKIN: multiple sebaceous cysts present on anterior chest/breasts (range in size from 0.5 - 1.0 cm in diameter) Foot: normal diabetic foot exam, no ulcerations present  ASSESSMENT & PLAN: 53 y.o. female presents for annual well woman/preventative exam and GYN exam. Please see problem specific assessment and plan.

## 2014-06-01 ENCOUNTER — Ambulatory Visit
Admission: RE | Admit: 2014-06-01 | Discharge: 2014-06-01 | Disposition: A | Discharge status: Home / Self Care | Payer: PRIVATE HEALTH INSURANCE | Source: Ambulatory Visit

## 2014-06-01 DIAGNOSIS — Z1231 Encounter for screening mammogram for malignant neoplasm of breast: Secondary | ICD-10-CM

## 2014-07-12 ENCOUNTER — Other Ambulatory Visit: Payer: Self-pay | Admitting: Family Medicine

## 2014-08-18 ENCOUNTER — Other Ambulatory Visit: Payer: Self-pay | Admitting: Family Medicine

## 2014-11-06 ENCOUNTER — Other Ambulatory Visit: Payer: Self-pay | Admitting: Family Medicine

## 2015-02-11 ENCOUNTER — Emergency Department (INDEPENDENT_AMBULATORY_CARE_PROVIDER_SITE_OTHER)
Admission: EM | Admit: 2015-02-11 | Discharge: 2015-02-11 | Disposition: A | Discharge status: Home / Self Care | Payer: PRIVATE HEALTH INSURANCE | Source: Home / Self Care | Attending: Family Medicine | Admitting: Family Medicine

## 2015-02-11 ENCOUNTER — Encounter (HOSPITAL_COMMUNITY): Payer: Self-pay | Admitting: Emergency Medicine

## 2015-02-11 DIAGNOSIS — M2141 Flat foot [pes planus] (acquired), right foot: Secondary | ICD-10-CM

## 2015-02-11 DIAGNOSIS — M722 Plantar fascial fibromatosis: Secondary | ICD-10-CM

## 2015-02-11 DIAGNOSIS — M2142 Flat foot [pes planus] (acquired), left foot: Secondary | ICD-10-CM

## 2015-02-11 HISTORY — DX: Essential (primary) hypertension: I10

## 2015-02-11 HISTORY — DX: Type 2 diabetes mellitus without complications: E11.9

## 2015-02-11 HISTORY — DX: Hyperlipidemia, unspecified: E78.5

## 2015-02-11 MED ORDER — DICLOFENAC SODIUM 75 MG PO TBEC: 75.0000 mg | DELAYED_RELEASE_TABLET | Freq: Two times a day (BID) | ORAL | Status: DC

## 2015-02-11 NOTE — Discharge Instructions (Signed)
You have R fasciitis. This likely started as a result of you changing her footwear to something without any arch support. Please follow the instructions and guidelines below for recovery. This may take several weeks to months in order to resolve. Please use the Voltaren for pain and inflammation relief. You can consider purchasing a brace if your pain does not improve over the next 1-2 weeks. A picture has been provided of these braces.    Plantar Fasciitis (Heel Spur Syndrome) with Rehab The plantar fascia is a fibrous, ligament-like, soft-tissue structure that spans the bottom of the foot. Plantar fasciitis is a condition that causes pain in the foot due to inflammation of the tissue. SYMPTOMS   Pain and tenderness on the underneath side of the foot.  Pain that worsens with standing or walking. CAUSES  Plantar fasciitis is caused by irritation and injury to the plantar fascia on the underneath side of the foot. Common mechanisms of injury include:  Direct trauma to bottom of the foot.  Damage to a small nerve that runs under the foot where the main fascia attaches to the heel bone.  Stress placed on the plantar fascia due to bone spurs. RISK INCREASES WITH:   Activities that place stress on the plantar fascia (running, jumping, pivoting, or cutting).  Poor strength and flexibility.  Improperly fitted shoes.  Tight calf muscles.  Flat feet.  Failure to warm-up properly before activity.  Obesity. PREVENTION  Warm up and stretch properly before activity.  Allow for adequate recovery between workouts.  Maintain physical fitness:  Strength, flexibility, and endurance.  Cardiovascular fitness.  Maintain a health body weight.  Avoid stress on the plantar fascia.  Wear properly fitted shoes, including arch supports for individuals who have flat feet. PROGNOSIS  If treated properly, then the symptoms of plantar fasciitis usually resolve without surgery. However,  occasionally surgery is necessary. RELATED COMPLICATIONS   Recurrent symptoms that may result in a chronic condition.  Problems of the lower back that are caused by compensating for the injury, such as limping.  Pain or weakness of the foot during push-off following surgery.  Chronic inflammation, scarring, and partial or complete fascia tear, occurring more often from repeated injections. TREATMENT  Treatment initially involves the use of ice and medication to help reduce pain and inflammation. The use of strengthening and stretching exercises may help reduce pain with activity, especially stretches of the Achilles tendon. These exercises may be performed at home or with a therapist. Your caregiver may recommend that you use heel cups of arch supports to help reduce stress on the plantar fascia. Occasionally, corticosteroid injections are given to reduce inflammation. If symptoms persist for greater than 6 months despite non-surgical (conservative), then surgery may be recommended.  MEDICATION   If pain medication is necessary, then nonsteroidal anti-inflammatory medications, such as aspirin and ibuprofen, or other minor pain relievers, such as acetaminophen, are often recommended.  Do not take pain medication within 7 days before surgery.  Prescription pain relievers may be given if deemed necessary by your caregiver. Use only as directed and only as much as you need.  Corticosteroid injections may be given by your caregiver. These injections should be reserved for the most serious cases, because they may only be given a certain number of times. HEAT AND COLD  Cold treatment (icing) relieves pain and reduces inflammation. Cold treatment should be applied for 10 to 15 minutes every 2 to 3 hours for inflammation and pain and immediately after any  activity that aggravates your symptoms. Use ice packs or massage the area with a piece of ice (ice massage).  Heat treatment may be used prior to  performing the stretching and strengthening activities prescribed by your caregiver, physical therapist, or athletic trainer. Use a heat pack or soak the injury in warm water. SEEK IMMEDIATE MEDICAL CARE IF:  Treatment seems to offer no benefit, or the condition worsens.  Any medications produce adverse side effects. EXERCISES RANGE OF MOTION (ROM) AND STRETCHING EXERCISES - Plantar Fasciitis (Heel Spur Syndrome) These exercises may help you when beginning to rehabilitate your injury. Your symptoms may resolve with or without further involvement from your physician, physical therapist or athletic trainer. While completing these exercises, remember:   Restoring tissue flexibility helps normal motion to return to the joints. This allows healthier, less painful movement and activity.  An effective stretch should be held for at least 30 seconds.  A stretch should never be painful. You should only feel a gentle lengthening or release in the stretched tissue. RANGE OF MOTION - Toe Extension, Flexion  Sit with your right / left leg crossed over your opposite knee.  Grasp your toes and gently pull them back toward the top of your foot. You should feel a stretch on the bottom of your toes and/or foot.  Hold this stretch for __________ seconds.  Now, gently pull your toes toward the bottom of your foot. You should feel a stretch on the top of your toes and or foot.  Hold this stretch for __________ seconds. Repeat __________ times. Complete this stretch __________ times per day.  RANGE OF MOTION - Ankle Dorsiflexion, Active Assisted  Remove shoes and sit on a chair that is preferably not on a carpeted surface.  Place right / left foot under knee. Extend your opposite leg for support.  Keeping your heel down, slide your right / left foot back toward the chair until you feel a stretch at your ankle or calf. If you do not feel a stretch, slide your bottom forward to the edge of the chair, while  still keeping your heel down.  Hold this stretch for __________ seconds. Repeat __________ times. Complete this stretch __________ times per day.  STRETCH - Gastroc, Standing  Place hands on wall.  Extend right / left leg, keeping the front knee somewhat bent.  Slightly point your toes inward on your back foot.  Keeping your right / left heel on the floor and your knee straight, shift your weight toward the wall, not allowing your back to arch.  You should feel a gentle stretch in the right / left calf. Hold this position for __________ seconds. Repeat __________ times. Complete this stretch __________ times per day. STRETCH - Soleus, Standing  Place hands on wall.  Extend right / left leg, keeping the other knee somewhat bent.  Slightly point your toes inward on your back foot.  Keep your right / left heel on the floor, bend your back knee, and slightly shift your weight over the back leg so that you feel a gentle stretch deep in your back calf.  Hold this position for __________ seconds. Repeat __________ times. Complete this stretch __________ times per day. STRETCH - Gastrocsoleus, Standing  Note: This exercise can place a lot of stress on your foot and ankle. Please complete this exercise only if specifically instructed by your caregiver.   Place the ball of your right / left foot on a step, keeping your other foot firmly on  the same step.  Hold on to the wall or a rail for balance.  Slowly lift your other foot, allowing your body weight to press your heel down over the edge of the step.  You should feel a stretch in your right / left calf.  Hold this position for __________ seconds.  Repeat this exercise with a slight bend in your right / left knee. Repeat __________ times. Complete this stretch __________ times per day.  STRENGTHENING EXERCISES - Plantar Fasciitis (Heel Spur Syndrome)  These exercises may help you when beginning to rehabilitate your injury. They may  resolve your symptoms with or without further involvement from your physician, physical therapist or athletic trainer. While completing these exercises, remember:   Muscles can gain both the endurance and the strength needed for everyday activities through controlled exercises.  Complete these exercises as instructed by your physician, physical therapist or athletic trainer. Progress the resistance and repetitions only as guided. STRENGTH - Towel Curls  Sit in a chair positioned on a non-carpeted surface.  Place your foot on a towel, keeping your heel on the floor.  Pull the towel toward your heel by only curling your toes. Keep your heel on the floor.  If instructed by your physician, physical therapist or athletic trainer, add ____________________ at the end of the towel. Repeat __________ times. Complete this exercise __________ times per day. STRENGTH - Ankle Inversion  Secure one end of a rubber exercise band/tubing to a fixed object (table, pole). Loop the other end around your foot just before your toes.  Place your fists between your knees. This will focus your strengthening at your ankle.  Slowly, pull your big toe up and in, making sure the band/tubing is positioned to resist the entire motion.  Hold this position for __________ seconds.  Have your muscles resist the band/tubing as it slowly pulls your foot back to the starting position. Repeat __________ times. Complete this exercises __________ times per day.  Document Released: 12/04/2005 Document Revised: 02/26/2012 Document Reviewed: 03/18/2009 [facility] Patient Information 2015 [place], [place]. This information is not intended to replace advice given to you by your health care provider. Make sure you discuss any questions you have with your health care provider.

## 2015-02-11 NOTE — ED Provider Notes (Signed)
CSN: [id number]     Arrival date & time 02/11/15  1228 History   First MD Initiated Contact with Patient 02/11/15 1409     Chief Complaint  Patient presents with  . Foot Pain   (Consider location/radiation/quality/duration/timing/severity/associated sxs/prior Treatment) HPI  L foot pain: pain along the bottom of the foot. Started 1 day ago. Painful when first getting out of bed in the morning. Worse w/ first step after resting for a while. Bottom of midfoot to heel. Tylenol w/o benefit. Constant. Worse w/ the longer pt ambulates but first step is worse. Denies any recent trauma. No change in exercise but changed new shoes last week (flats - no arch).   Past Medical History  Diagnosis Date  . Obesity, Class III, BMI 40-49.9 (morbid obesity) 10/07/2002    BMI was 41.9  . Hypertension   . HLD (hyperlipidemia)   . Diabetes mellitus without complication    Past Surgical History  Procedure Laterality Date  . Vaginal hysterectomy  01/19/2000    large fibroid   Family History  Problem Relation Age of Onset  . Diabetes Father   . Hypertension Father   . Hypertension Mother   . Hypertension Sister   . Arthritis Sister   . Diabetes Brother   . Diabetes Brother   . Drug abuse Brother   . Diabetes Maternal Grandmother     breast   History  Substance Use Topics  . Smoking status: Never Smoker   . Smokeless tobacco: Not on file  . Alcohol Use: 3.0 oz/week    3 Cans of beer, 2 Shots of liquor per week   OB History    No data available     Review of Systems Per HPI with all other pertinent systems negative.   Allergies  Review of patient's allergies indicates no known allergies.  Home Medications   Prior to Admission medications   Medication Sig Start Date End Date Taking? Authorizing Provider  aspirin 81 MG EC tablet Take 81 mg by mouth daily.     Historical Provider, MD  benazepril-hydrochlorthiazide (LOTENSIN HCT) 20-12.5 MG per tablet TAKE 1 TABLET BY MOUTH TWICE A DAY  (INSURANCE WILL ONLY DO 30 DAY SUPPLY AT A TIME) 11/06/14   Lupita Dawn, MD  diclofenac (VOLTAREN) 75 MG EC tablet Take 1 tablet (75 mg total) by mouth 2 (two) times daily. 02/11/15   Waldemar Dickens, MD  metFORMIN (GLUCOPHAGE) 1000 MG tablet TAKE 1 AND 1/2 TABLETS BY MOUTH EVERY MORNING AND 1 TABLET IN THE EVENING 08/18/14   Lupita Dawn, MD  Multiple Vitamins-Minerals (MULTIVITAMIN WITH MINERALS) tablet Take 1 tablet by mouth daily.      Historical Provider, MD  simvastatin (ZOCOR) 40 MG tablet Take 1 tablet (40 mg total) by mouth at bedtime. 05/15/14   Lupita Dawn, MD   BP 135/73 mmHg  Pulse 74  Temp(Src) 97.4 F (36.3 C)  Resp 16  SpO2 99% Physical Exam  Constitutional: She is oriented to person, place, and time. She appears well-developed and well-nourished.  HENT:  Head: Normocephalic and atraumatic.  Eyes: EOM are normal. Pupils are equal, round, and reactive to light.  Neck: Normal range of motion.  Cardiovascular: Normal rate.   Pulmonary/Chest: Effort normal. No respiratory distress.  Abdominal: She exhibits no distension.  Musculoskeletal:  L ankle stable.  Heel and midfoot plantar surface ttp. No swelling or induration Pes planus bilateral  Neurological: She is alert and oriented to person, place, and time.  Skin: Skin is warm.  Psychiatric: She has a normal mood and affect. Her behavior is normal. Thought content normal.    ED Course  Procedures (including critical care time) Labs Review Labs Reviewed - No data to display  Imaging Review No results found.   MDM   1. Plantar fasciitis of left foot   2. Pes planus of both feet    Left foot with likely plantar fasciitis. Discussed likely length of this condition. Anticipate this was started after changing to using flats with significant amount of walking at work. Patient provided with work note to be able to use comfortable shoes with significant arch support. Exercise handout provided. Prescription for Voltaren  provided.    Waldemar Dickens, MD 02/11/15 [phone]

## 2015-02-11 NOTE — ED Notes (Signed)
Left foot pain, woke with pain yesterday morning.  Pain in heel/instep and is unbearable with weight bearing

## 2015-05-19 ENCOUNTER — Other Ambulatory Visit: Payer: Self-pay | Admitting: Family Medicine

## 2015-05-20 NOTE — Telephone Encounter (Signed)
Pt informed and scheduled to see dr Ree Kida on June 21.Jasmine Dickson Holter, CMA

## 2015-05-20 NOTE — Telephone Encounter (Signed)
Nursing staff - please call patient to schedule follow up for diabetes. I will refill 1 month supply of metformin to get her to her appointment.

## 2015-06-08 ENCOUNTER — Encounter: Payer: Self-pay | Admitting: Family Medicine

## 2015-06-08 ENCOUNTER — Ambulatory Visit (INDEPENDENT_AMBULATORY_CARE_PROVIDER_SITE_OTHER): Payer: PRIVATE HEALTH INSURANCE | Admitting: Family Medicine

## 2015-06-08 VITALS — BP 120/73 | HR 79 | Temp 98.1°F | Ht 65.0 in | Wt 228.2 lb

## 2015-06-08 DIAGNOSIS — E119 Type 2 diabetes mellitus without complications: Secondary | ICD-10-CM | POA: Diagnosis not present

## 2015-06-08 DIAGNOSIS — Z0000 Encounter for general adult medical examination without abnormal findings: Secondary | ICD-10-CM

## 2015-06-08 DIAGNOSIS — IMO0002 Reserved for concepts with insufficient information to code with codable children: Secondary | ICD-10-CM

## 2015-06-08 DIAGNOSIS — I10 Essential (primary) hypertension: Secondary | ICD-10-CM

## 2015-06-08 DIAGNOSIS — E785 Hyperlipidemia, unspecified: Secondary | ICD-10-CM

## 2015-06-08 DIAGNOSIS — E1165 Type 2 diabetes mellitus with hyperglycemia: Secondary | ICD-10-CM | POA: Diagnosis not present

## 2015-06-08 DIAGNOSIS — E040 Nontoxic diffuse goiter: Secondary | ICD-10-CM

## 2015-06-08 DIAGNOSIS — E012 Iodine-deficiency related (endemic) goiter, unspecified: Secondary | ICD-10-CM

## 2015-06-08 LAB — LIPID PANEL
CHOLESTEROL: 201 mg/dL — AB (ref 0–200)
HDL: 57 mg/dL (ref >=46)
LDL Cholesterol: 133 mg/dL — ABNORMAL HIGH (ref 0–99)
TRIGLYCERIDES: 54 mg/dL (ref <150)
Total CHOL/HDL Ratio: 3.5 Ratio
VLDL: 11 mg/dL (ref 0–40)

## 2015-06-08 LAB — POCT GLYCOSYLATED HEMOGLOBIN (HGB A1C): Hemoglobin A1C: 6.9

## 2015-06-08 LAB — CBC
HCT: 37.4 % (ref 36.0–46.0)
HEMOGLOBIN: 12.8 g/dL (ref 12.0–15.0)
MCH: 30.5 pg (ref 26.0–34.0)
MCHC: 34.2 g/dL (ref 30.0–36.0)
MCV: 89.3 fL (ref 78.0–100.0)
MPV: 9.9 fL (ref 8.6–12.4)
Platelets: 348 10*3/uL (ref 150–400)
RBC: 4.19 MIL/uL (ref 3.87–5.11)
RDW: 13.1 % (ref 11.5–15.5)
WBC: 7.1 10*3/uL (ref 4.0–10.5)

## 2015-06-08 LAB — COMPREHENSIVE METABOLIC PANEL
ALBUMIN: 4 g/dL (ref 3.5–5.2)
ALT: 11 U/L (ref 0–35)
AST: 12 U/L (ref 0–37)
Alkaline Phosphatase: 60 U/L (ref 39–117)
BUN: 10 mg/dL (ref 6–23)
CALCIUM: 9.9 mg/dL (ref 8.4–10.5)
CHLORIDE: 99 meq/L (ref 96–112)
CO2: 26 meq/L (ref 19–32)
Creat: 0.71 mg/dL (ref 0.50–1.10)
GLUCOSE: 107 mg/dL — AB (ref 70–99)
POTASSIUM: 3.9 meq/L (ref 3.5–5.3)
Sodium: 137 mEq/L (ref 135–145)
Total Bilirubin: 0.9 mg/dL (ref 0.2–1.2)
Total Protein: 7.1 g/dL (ref 6.0–8.3)

## 2015-06-08 LAB — TSH: TSH: 0.896 u[IU]/mL (ref 0.350–4.500)

## 2015-06-08 LAB — HIV ANTIBODY (ROUTINE TESTING W REFLEX): HIV: NONREACTIVE

## 2015-06-08 MED ORDER — BENAZEPRIL-HYDROCHLOROTHIAZIDE 20-12.5 MG PO TABS: 1.0000 | ORAL_TABLET | Freq: Two times a day (BID) | ORAL | Status: DC

## 2015-06-08 MED ORDER — HYDROCHLOROTHIAZIDE 25 MG PO TABS: 25.0000 mg | ORAL_TABLET | Freq: Every day | ORAL | Status: DC

## 2015-06-08 MED ORDER — LISINOPRIL 40 MG PO TABS: 40.0000 mg | ORAL_TABLET | Freq: Every day | ORAL | Status: DC

## 2015-06-08 NOTE — Patient Instructions (Signed)
It was nice to see you today.  Dr. Ree Kida will call you with your lab results.  My nursing staff will call you to schedule a colonoscopy.   You have been referred to an eye doctor.

## 2015-06-09 NOTE — Assessment & Plan Note (Signed)
Patient not taking Simvastatin due to cost. Due for lipid profile today. -recalculate ASCVD and will make recommendation on medication to patient

## 2015-06-09 NOTE — Assessment & Plan Note (Signed)
Controlled on Benazipril-HCTZ however expensive for patient. -change to Lisinopril 40 mg daily and HCTZ 25 mg daily

## 2015-06-09 NOTE — Assessment & Plan Note (Signed)
Referral made to GI for colonoscopy

## 2015-06-09 NOTE — Progress Notes (Signed)
   Subjective:    Patient ID: Jasmine Dickson, female    DOB: 1961/09/29, 54 y.o.   MRN: [id number]  HPI 53 y/o female presents for routine follow up.  HTN - currently on Benazipril-HCTZ 20-12.5 mg BID, no chest pain, no headaches, no vision changes, no sides effect, recently changed insurance and medication costs $50 per month  Type 2 Diabetes - currently on metformin 1000 mg BID, tolerating well, no diarrhea, does not check blood sugars, has not seen an eye doctor in over 3 years  Obesity - patient reports improved diet, mostly eating chicken/salmon, minimal red meat, multiple servings of fruit and vegetables per day, no soda, trying to drink a lot of water; exercising almost daily, does a 30 minute walking/workout video at home.   HLD - has not been taking Simvastatin due to cost  Preventative - would like referral to eye doctor and GI doctor for colonoscopy  Social - works for [employer], not a smoker   Review of Systems  Constitutional: Negative for chills and fatigue.  Respiratory: Negative for cough and shortness of breath.   Cardiovascular: Negative for chest pain and leg swelling.  Gastrointestinal: Negative for nausea, vomiting and diarrhea.       Objective:   Physical Exam Vitals: reviewed Gen: pleasant AAF, NAD, obese HEENT: normocephalic, PERRL, EOMI, no scleral icterus, nasal septum midline, no rhinorrhea, MMM, uvula midline, neck supple, no anterior or posterior cervical lymphadenopathy Cardiac: RRR, S1 and S2 present, no murmur, no heaves/thrills Resp: CTAB, normal effort Ext: trace edema, 2+ radial pulses  A1C 6.9     Assessment & Plan:  Please see problem specific assessment and plan.

## 2015-06-09 NOTE — Assessment & Plan Note (Signed)
Patient has had 5 pound weight loss from one year ago. Reports dietary changes and frequent exercise -patient encouraged to continue these positive changes

## 2015-06-09 NOTE — Assessment & Plan Note (Signed)
Due for recheck of TSH, patient asymptomatic

## 2015-06-09 NOTE — Assessment & Plan Note (Signed)
Controlled. A1C 6.9 -continue dietary changes -continue metformin 1000 mg BID -referral made to ophthalmology

## 2015-06-11 ENCOUNTER — Telehealth: Payer: Self-pay | Admitting: Family Medicine

## 2015-06-11 DIAGNOSIS — E785 Hyperlipidemia, unspecified: Secondary | ICD-10-CM

## 2015-06-11 MED ORDER — SIMVASTATIN 40 MG PO TABS: 40.0000 mg | ORAL_TABLET | Freq: Every day | ORAL | Status: DC

## 2015-06-11 NOTE — Telephone Encounter (Signed)
Discussed lab results. Normal except for lipid profile. ASCVD 7.0%. Will restart Simvastatin.

## 2015-06-11 NOTE — Assessment & Plan Note (Signed)
ASCVD 7.0%. Restarted Simvastatin 40 mg daily.

## 2015-06-28 ENCOUNTER — Other Ambulatory Visit: Payer: Self-pay | Admitting: Family Medicine

## 2015-08-09 ENCOUNTER — Other Ambulatory Visit: Payer: Self-pay | Admitting: Family Medicine

## 2015-12-07 ENCOUNTER — Other Ambulatory Visit: Payer: Self-pay | Admitting: Family Medicine

## 2015-12-31 ENCOUNTER — Ambulatory Visit (INDEPENDENT_AMBULATORY_CARE_PROVIDER_SITE_OTHER): Payer: PRIVATE HEALTH INSURANCE | Admitting: Family Medicine

## 2015-12-31 ENCOUNTER — Encounter: Payer: Self-pay | Admitting: Family Medicine

## 2015-12-31 VITALS — BP 138/63 | HR 86 | Temp 97.9°F | Wt 224.4 lb

## 2015-12-31 DIAGNOSIS — E119 Type 2 diabetes mellitus without complications: Secondary | ICD-10-CM | POA: Diagnosis not present

## 2015-12-31 DIAGNOSIS — R011 Cardiac murmur, unspecified: Secondary | ICD-10-CM

## 2015-12-31 DIAGNOSIS — E785 Hyperlipidemia, unspecified: Secondary | ICD-10-CM

## 2015-12-31 DIAGNOSIS — I10 Essential (primary) hypertension: Secondary | ICD-10-CM | POA: Diagnosis not present

## 2015-12-31 DIAGNOSIS — E1165 Type 2 diabetes mellitus with hyperglycemia: Secondary | ICD-10-CM | POA: Diagnosis not present

## 2015-12-31 DIAGNOSIS — IMO0001 Reserved for inherently not codable concepts without codable children: Secondary | ICD-10-CM

## 2015-12-31 LAB — BASIC METABOLIC PANEL
BUN: 14 mg/dL (ref 7–25)
CALCIUM: 9.8 mg/dL (ref 8.6–10.4)
CO2: 29 mmol/L (ref 20–31)
CREATININE: 0.67 mg/dL (ref 0.50–1.05)
Chloride: 100 mmol/L (ref 98–110)
GLUCOSE: 103 mg/dL — AB (ref 65–99)
Potassium: 3.9 mmol/L (ref 3.5–5.3)
Sodium: 137 mmol/L (ref 135–146)

## 2015-12-31 LAB — POCT GLYCOSYLATED HEMOGLOBIN (HGB A1C): HEMOGLOBIN A1C: 7.1

## 2015-12-31 MED ORDER — SIMVASTATIN 20 MG PO TABS: 20.0000 mg | ORAL_TABLET | Freq: Every day | ORAL | Status: DC

## 2015-12-31 NOTE — Assessment & Plan Note (Signed)
Diabetes is stable. A1C 7.1 today. - continue metformin 1000 mg BID -encouraged follow up with eye doctor -foot exam unremarkable today -encouraged lifestyle modifications

## 2015-12-31 NOTE — Assessment & Plan Note (Signed)
Weight loss counseling provided. Discussed dietary and activity changes.

## 2015-12-31 NOTE — Progress Notes (Signed)
   Subjective:    Patient ID: Jasmine Dickson, female    DOB: [birth year], 55 y.o.   MRN: :[id number]  HPI 55 y/o female presents for follow up of diabetes, HTN, and obesity.   T2DM - currently on Metformin 1000 mg AM and 1000 mg PM, previously on 1500 mg in the AM (was feeling weak in the AM with the increased dose). Has not seen eye doctor in the past year.   HTN- on Lisinopril 40 mg daily and HCTZ 25 mg daily, no chest, no vision changes, no headaches; was having headaches when transitioned to Lisinopril but resolved  Obesity - has not been working out regularly, did purchase bicycle over Christmas, was previously walking and doing workout video (every day) and felt better. Down 4 pounds from 6 months ago. Attempting to eat more fruits/vegetables, not eating a lot of red meat. Eating 3 meals per day. Very little dairy intake.   HLD - not taking Simvastatin, had insomnia/leg pain with this medication.   Social - nonsmoker   Review of Systems  Constitutional: Negative for fever, chills and fatigue.  Respiratory: Negative for cough, choking and shortness of breath.   Cardiovascular: Negative for chest pain and leg swelling.  Gastrointestinal: Negative for nausea, vomiting and diarrhea.       Objective:   Physical Exam Vitals: reviewed Gen: pleasant female, NAD Cardiac: RRR, S1 and S2 present, 3/6 murmur over right 2nd intercostal space Resp: CTAB, normal effort Ext: no edema Foot exam: see quality metrics   POC A1C 7.1    Assessment & Plan:  HYPERTENSION, BENIGN SYSTEMIC Controlled. No change in pharmacotherapy. -continue Lisinopril 40 mg daily and HCTZ 25 mg daily.   Diabetes type 2, uncontrolled Diabetes is stable. A1C 7.1 today. - continue metformin 1000 mg BID -encouraged follow up with eye doctor -foot exam unremarkable today -encouraged lifestyle modifications  Systolic murmur 3/6 systolic murmur over right 2nd intercostal space. Patient asymptomatic. No preveous  echo. -2D echo ordered to evaluate cardiac structure   Obesity, Class III, BMI 40-49.9 (morbid obesity) Weight loss counseling provided. Discussed dietary and activity changes.   Dyslipidemia Patient not tolerating 40 mg Simvastatin (muscle cramps) -decreased dose to 20 mg daily

## 2015-12-31 NOTE — Assessment & Plan Note (Signed)
3/6 systolic murmur over right 2nd intercostal space. Patient asymptomatic. No preveous echo. -2D echo ordered to evaluate cardiac structure

## 2015-12-31 NOTE — Assessment & Plan Note (Signed)
Patient not tolerating 40 mg Simvastatin (muscle cramps) -decreased dose to 20 mg daily

## 2015-12-31 NOTE — Patient Instructions (Signed)
It was nice to see you today.  Blood pressure - well controlled, continue HCTZ and Lisinopril  Diabetes - A1C up a little, start to exercise regularly, see you eye doctor  Cholesterol - start Simvastatin 20 mg daily  Heart Murmur - check Ultrasound of the heart  Return in 3 months for preventative exam.

## 2015-12-31 NOTE — Assessment & Plan Note (Signed)
Controlled. No change in pharmacotherapy. -continue Lisinopril 40 mg daily and HCTZ 25 mg daily.

## 2016-01-01 LAB — HEPATITIS C ANTIBODY: HCV AB: NEGATIVE

## 2016-01-03 ENCOUNTER — Encounter: Payer: Self-pay | Admitting: Family Medicine

## 2016-01-06 ENCOUNTER — Other Ambulatory Visit (HOSPITAL_COMMUNITY): Payer: PRIVATE HEALTH INSURANCE

## 2016-01-17 ENCOUNTER — Ambulatory Visit (HOSPITAL_COMMUNITY): Payer: PRIVATE HEALTH INSURANCE | Attending: Internal Medicine

## 2016-01-17 ENCOUNTER — Other Ambulatory Visit: Payer: Self-pay

## 2016-01-17 DIAGNOSIS — E119 Type 2 diabetes mellitus without complications: Secondary | ICD-10-CM | POA: Diagnosis not present

## 2016-01-17 DIAGNOSIS — E785 Hyperlipidemia, unspecified: Secondary | ICD-10-CM | POA: Insufficient documentation

## 2016-01-17 DIAGNOSIS — R011 Cardiac murmur, unspecified: Secondary | ICD-10-CM | POA: Insufficient documentation

## 2016-01-17 DIAGNOSIS — I10 Essential (primary) hypertension: Secondary | ICD-10-CM | POA: Insufficient documentation

## 2016-01-17 DIAGNOSIS — I071 Rheumatic tricuspid insufficiency: Secondary | ICD-10-CM | POA: Diagnosis not present

## 2016-01-18 ENCOUNTER — Encounter: Payer: Self-pay | Admitting: Family Medicine

## 2016-04-09 ENCOUNTER — Other Ambulatory Visit: Payer: Self-pay | Admitting: Family Medicine

## 2016-06-11 ENCOUNTER — Other Ambulatory Visit: Payer: Self-pay | Admitting: Family Medicine

## 2016-11-28 ENCOUNTER — Other Ambulatory Visit: Payer: Self-pay | Admitting: Family Medicine

## 2016-12-08 ENCOUNTER — Other Ambulatory Visit: Payer: Self-pay | Admitting: Family Medicine

## 2017-03-28 ENCOUNTER — Other Ambulatory Visit: Payer: Self-pay | Admitting: Family Medicine

## 2017-05-27 ENCOUNTER — Other Ambulatory Visit: Payer: Self-pay | Admitting: Family Medicine

## 2017-08-14 ENCOUNTER — Other Ambulatory Visit: Payer: Self-pay | Admitting: *Deleted

## 2017-08-15 MED ORDER — SIMVASTATIN 20 MG PO TABS: 20.0000 mg | ORAL_TABLET | Freq: Every day | ORAL | 0 refills | Status: DC

## 2018-01-11 ENCOUNTER — Ambulatory Visit
Admission: RE | Admit: 2018-01-11 | Discharge: 2018-01-11 | Disposition: A | Discharge status: Home / Self Care | Payer: PRIVATE HEALTH INSURANCE | Source: Ambulatory Visit | Attending: Family Medicine | Admitting: Family Medicine

## 2018-01-11 ENCOUNTER — Other Ambulatory Visit: Payer: Self-pay

## 2018-01-11 ENCOUNTER — Ambulatory Visit (INDEPENDENT_AMBULATORY_CARE_PROVIDER_SITE_OTHER): Payer: PRIVATE HEALTH INSURANCE | Admitting: Family Medicine

## 2018-01-11 ENCOUNTER — Encounter: Payer: Self-pay | Admitting: Family Medicine

## 2018-01-11 VITALS — BP 120/62 | HR 78 | Temp 98.1°F | Ht 65.0 in | Wt 217.2 lb

## 2018-01-11 DIAGNOSIS — E1165 Type 2 diabetes mellitus with hyperglycemia: Secondary | ICD-10-CM

## 2018-01-11 DIAGNOSIS — M25561 Pain in right knee: Secondary | ICD-10-CM

## 2018-01-11 DIAGNOSIS — I10 Essential (primary) hypertension: Secondary | ICD-10-CM

## 2018-01-11 DIAGNOSIS — M25562 Pain in left knee: Secondary | ICD-10-CM | POA: Diagnosis not present

## 2018-01-11 LAB — POCT GLYCOSYLATED HEMOGLOBIN (HGB A1C): HEMOGLOBIN A1C: 6.5

## 2018-01-11 MED ORDER — LOSARTAN POTASSIUM 50 MG PO TABS: 50.0000 mg | ORAL_TABLET | Freq: Every day | ORAL | 3 refills | Status: DC

## 2018-01-11 NOTE — Progress Notes (Signed)
Subjective:    Jasmine Dickson is a 57 y.o. female who presents to [facility] today for knee pain:  1.  BL knee pain:  Worse on Left.  New on Right.  Left side knee pain is chronic.  She has periods of locking, giving way, snapping in that knee.  Worse when she sits and tries to rotate such as when trying to get into a car.  She has not had any actual falls.  She works out fairly regularly which includes for her elliptical and riding a stationary bicycle.  She says she "overdid it" last week and has been having some mild right knee pain since then.  She wants to make sure there is nothing else going on.  No locking or giving way in her right knee.  No swelling.  No redness.  No injury that she knows of.  2. Hypertension:  Long-term problem for this patient.  Previuosly she was on benazepril, but could not afford this and was started on lisinopril instead.  She has noticed some facial flushing when she takes her lisinopril.  She is asking if she can take any other medicines related to these.   No HA, CP, dizziness, shortness of breath, palpitations, or LE swelling.   BP Readings from Last 3 Encounters:  01/11/18 120/62  12/31/15 138/63  06/08/15 120/73   3.   Diabetes:  Currently on Metformin.   No adverse effects from medication.  No hypoglycemic events.  No paresthesia or peripheral nerve pain.  No polyuria/polydipsia.  Measures blood sugars at home every -- doesn't measure CBGs at home.  Has been exercising more to control her weight and CBGs.     Lab Results  Component Value Date   HGBA1C 6.5 01/11/2018   Prev health:  Currently overdue for multiple things.  Will defer much of this to next visit as she hasn't been here for 2 years.  .  ROS as above per HPI.    The following portions of the patient's history were reviewed and updated as appropriate: allergies, current medications, past medical history, family and social history, and problem list. Patient is a nonsmoker.    PMH reviewed.  Past  Medical History:  Diagnosis Date  . Diabetes mellitus without complication ([place])   . HLD (hyperlipidemia)   . Hypertension   . Obesity, Class III, BMI 40-49.9 (morbid obesity) ([place]) 10/07/2002   BMI was 41.9   Past Surgical History:  Procedure Laterality Date  . VAGINAL HYSTERECTOMY  01/19/2000   large fibroid    Medications reviewed. Current Outpatient Medications  Medication Sig Dispense Refill  . aspirin 81 MG EC tablet Take 81 mg by mouth daily.     . cyanocobalamin 1000 MCG tablet Take 1,000 mcg by mouth daily.    . hydrochlorothiazide (HYDRODIURIL) 25 MG tablet TAKE 1 TABLET BY MOUTH EVERY DAY 90 tablet 1  . losartan (COZAAR) 50 MG tablet Take 1 tablet (50 mg total) by mouth daily. 90 tablet 3  . metFORMIN (GLUCOPHAGE) 1000 MG tablet TAKE 1 AND 1/2 TABLETS BY MOUTH EVERY MORNING AND 1 TABLET IN THE EVENING 225 tablet 1  . Multiple Vitamins-Minerals (MULTIVITAMIN WITH MINERALS) tablet Take 1 tablet by mouth daily.      . simvastatin (ZOCOR) 20 MG tablet Take 1 tablet (20 mg total) by mouth daily at 6 PM. Needs OV before any refills provided. 30 tablet 0  . zinc gluconate 50 MG tablet Take 50 mg by mouth daily.  No current facility-administered medications for this visit.      Objective:   Physical Exam BP 120/62   Pulse 78   Temp 98.1 F (36.7 C) (Oral)   Ht 5\' 5"  (1.651 m)   Wt 217 lb 3.2 oz (98.5 kg)   SpO2 99%   BMI 36.14 kg/m  Gen:  Alert, cooperative patient who appears stated age in no acute distress.  Vital signs reviewed. HEENT: EOMI,  MMM Cardiac:  Regular rate and rhythm without murmur auscultated.  Good S1/S2. Pulm:  Clear to auscultation bilaterally with good air movement.  No wheezes or rales noted.   MSK:  Left knee with no joint laxity.  Some mild effusion noted.  No TTP along medial or lateral joint lines.  McMurray's negative.   - Right knee with no effusion.  No tenderness.  Strength testing good.  No laxity.  Essentially entirely normal knee  exam.   Ext:  No LE edema  Results for orders placed or performed in visit on 01/11/18 (from the past 72 hour(s))  POCT glycosylated hemoglobin (Hb A1C)     Status: None   Collection Time: 01/11/18 11:40 AM  Result Value Ref Range   Hemoglobin A1C 6.5    Impression/plan: 1.  Hypertension: -I have switched her to losartan and decreased her dose due to her blood pressure. -Follow-up in 3 months to assess for any improvement. -I printed her a coupon for her medicine as well.  2.  Diabetes: -As above she is overdue for a lot of preventative medicine related to this. -A1c is much improved today.  Continue metformin.  Continue exercise and diet changes. -Has recovered so much today she has not been here for 2 years we will check labs including lipids and A1c today. -We will do other preventative medicine issues next visit.  3.  Knee pain, bilateral: -Believe her right knee pain is indeed secondary to mild overuse injury.  Examination was essentially negative today for anything. -Left knee with concern for cartilage tear due to symptoms.  Examination testing was equivocal. -Obtaining x-rays of knee. -No need for any pain medicine currently.  Continue exercise as tolerated.  To strengthen her quadriceps more she will be doing more stationary biking.

## 2018-01-11 NOTE — Patient Instructions (Signed)
It was very good to me today.  Take the losartan as blood pressure medicine.  Stop the lisinopril.  We are checking some labs today.  I will let you know what these look like from the results come back.  Go to [facility] imaging to have the x-rays of your knees.  Once I get the results of collection in this as well.  I do think you have some issues with the cartilage in your left knee.  Exercising especially using the bike to strengthen your thighs and legs is the best treatment for this.  The website for drug coupons is [employer].  You can use this has an app on your phone.

## 2018-01-12 LAB — CBC
HEMOGLOBIN: 11.5 g/dL (ref 11.1–15.9)
Hematocrit: 35 % (ref 34.0–46.6)
MCH: 29.1 pg (ref 26.6–33.0)
MCHC: 32.9 g/dL (ref 31.5–35.7)
MCV: 89 fL (ref 79–97)
Platelets: 363 10*3/uL (ref 150–379)
RBC: 3.95 x10E6/uL (ref 3.77–5.28)
RDW: 13.7 % (ref 12.3–15.4)
WBC: 6.9 10*3/uL (ref 3.4–10.8)

## 2018-01-12 LAB — COMPREHENSIVE METABOLIC PANEL
ALBUMIN: 4.2 g/dL (ref 3.5–5.5)
ALT: 13 IU/L (ref 0–32)
AST: 13 IU/L (ref 0–40)
Albumin/Globulin Ratio: 1.6 (ref 1.2–2.2)
Alkaline Phosphatase: 67 IU/L (ref 39–117)
BUN/Creatinine Ratio: 21 (ref 9–23)
BUN: 15 mg/dL (ref 6–24)
Bilirubin Total: 0.4 mg/dL (ref 0.0–1.2)
CALCIUM: 9.9 mg/dL (ref 8.7–10.2)
CO2: 26 mmol/L (ref 20–29)
CREATININE: 0.73 mg/dL (ref 0.57–1.00)
Chloride: 103 mmol/L (ref 96–106)
GFR calc Af Amer: 107 mL/min/{1.73_m2} (ref >59)
GFR, EST NON AFRICAN AMERICAN: 93 mL/min/{1.73_m2} (ref >59)
Globulin, Total: 2.7 g/dL (ref 1.5–4.5)
Glucose: 97 mg/dL (ref 65–99)
Potassium: 4.3 mmol/L (ref 3.5–5.2)
Sodium: 142 mmol/L (ref 134–144)
Total Protein: 6.9 g/dL (ref 6.0–8.5)

## 2018-01-12 LAB — LIPID PANEL
CHOLESTEROL TOTAL: 183 mg/dL (ref 100–199)
Chol/HDL Ratio: 2.8 ratio (ref 0.0–4.4)
HDL: 66 mg/dL (ref >39)
LDL CALC: 109 mg/dL — AB (ref 0–99)
Triglycerides: 38 mg/dL (ref 0–149)
VLDL CHOLESTEROL CAL: 8 mg/dL (ref 5–40)

## 2018-01-12 LAB — TSH: TSH: 0.289 u[IU]/mL — ABNORMAL LOW (ref 0.450–4.500)

## 2018-01-18 ENCOUNTER — Telehealth: Payer: Self-pay | Admitting: Family Medicine

## 2018-01-18 DIAGNOSIS — R7989 Other specified abnormal findings of blood chemistry: Secondary | ICD-10-CM

## 2018-01-18 DIAGNOSIS — E0590 Thyrotoxicosis, unspecified without thyrotoxic crisis or storm: Secondary | ICD-10-CM

## 2018-01-18 NOTE — Telephone Encounter (Signed)
Called and spoke with patient.  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  Also discussed low TSH.  She will FU for repeat TSH in 2 weeks.  If still low, will consider tx.  She will call for lab appt in 2 weeks.

## 2018-01-21 ENCOUNTER — Other Ambulatory Visit: Payer: PRIVATE HEALTH INSURANCE

## 2018-01-21 ENCOUNTER — Telehealth: Payer: Self-pay | Admitting: Family Medicine

## 2018-01-21 DIAGNOSIS — R7989 Other specified abnormal findings of blood chemistry: Secondary | ICD-10-CM

## 2018-01-21 NOTE — Telephone Encounter (Addendum)
Message not clear. Since it is non-urgent will defer to [facility] who will be back in 2 days.  ?? Losartan is what she is on currently or Lisinopril?  Which is she having negative reaction with?  Need proper clarification.  And please have her see PCP soon. If having med reaction, need to hold off on it and come in to be seen right away.

## 2018-01-21 NOTE — Telephone Encounter (Signed)
Patient came to office for labs & wanted to let  Dr Mingo Amber know the RX Lisinopril was not causing the issues with her head. This was changed to RX Losartan however still issue.

## 2018-01-22 LAB — TSH: TSH: 0.265 u[IU]/mL — ABNORMAL LOW (ref 0.450–4.500)

## 2018-02-05 ENCOUNTER — Telehealth: Payer: Self-pay | Admitting: Family Medicine

## 2018-02-05 DIAGNOSIS — R7989 Other specified abnormal findings of blood chemistry: Secondary | ICD-10-CM

## 2018-02-05 NOTE — Telephone Encounter (Signed)
Called and spoke with patient about persistently low TSH.  After discussion will send to endocrinology, which she prefers.  I will place order today.

## 2018-03-05 ENCOUNTER — Encounter (HOSPITAL_COMMUNITY): Payer: Self-pay | Admitting: Emergency Medicine

## 2018-03-05 ENCOUNTER — Ambulatory Visit (HOSPITAL_COMMUNITY)
Admission: EM | Admit: 2018-03-05 | Discharge: 2018-03-05 | Disposition: A | Discharge status: Home / Self Care | Payer: PRIVATE HEALTH INSURANCE | Attending: Family Medicine | Admitting: Family Medicine

## 2018-03-05 DIAGNOSIS — E0590 Thyrotoxicosis, unspecified without thyrotoxic crisis or storm: Secondary | ICD-10-CM

## 2018-03-05 DIAGNOSIS — R6889 Other general symptoms and signs: Secondary | ICD-10-CM | POA: Diagnosis not present

## 2018-03-05 MED ORDER — IBUPROFEN 800 MG PO TABS: 800.0000 mg | ORAL_TABLET | Freq: Three times a day (TID) | ORAL | 0 refills | Status: DC

## 2018-03-05 MED ORDER — OSELTAMIVIR PHOSPHATE 75 MG PO CAPS: 75.0000 mg | ORAL_CAPSULE | Freq: Two times a day (BID) | ORAL | 0 refills | Status: DC

## 2018-03-05 NOTE — ED Triage Notes (Signed)
Pt has multiple complaints. Pt states "I had my eyes dilated yesterday, and ever since then i've felt bad, i've had a headache, cold chills, dizziness, stomach pain, diarrhea, no appetite.

## 2018-03-05 NOTE — Discharge Instructions (Signed)
You have all the symptoms of influenza B.    Start the Tamiflu ASAP  You also have hyperthyroidism which needs attention.   If you are getting worse, come back or go to the emergency department

## 2018-03-05 NOTE — ED Provider Notes (Signed)
[place]   [id number] 03/05/18 Arrival Time: 50   SUBJECTIVE:  Jasmine Dickson is a 57 y.o. female who presents to the urgent care with complaint of multiple complaints. Pt states "I had my eyes dilated yesterday, and ever since then i've felt bad, i've had a headache, cold chills, dizziness, stomach pain, diarrhea, no appetite  Past Medical History:  Diagnosis Date  . Diabetes mellitus without complication ([place])   . HLD (hyperlipidemia)   . Hypertension   . Obesity, Class III, BMI 40-49.9 (morbid obesity) ([place]) 10/07/2002   BMI was 41.9   Family History  Problem Relation Age of Onset  . Diabetes Father   . Hypertension Father   . Hypertension Mother   . Hypertension Sister   . Arthritis Sister   . Diabetes Brother   . Drug abuse Brother   . Diabetes Brother   . Diabetes Maternal Grandmother        breast   Social History   Socioeconomic History  . Marital status: Married    Spouse name: Not on file  . Number of children: 1  . Years of education: 65  . Highest education level: Not on file  Social Needs  . Financial resource strain: Not on file  . Food insecurity - worry: Not on file  . Food insecurity - inability: Not on file  . Transportation needs - medical: Not on file  . Transportation needs - non-medical: Not on file  Occupational History    Employer: [employer]   Tobacco Use  . Smoking status: Never Smoker  . Smokeless tobacco: Never Used  Substance and Sexual Activity  . Alcohol use: Yes    Alcohol/week: 3.0 oz    Types: 3 Cans of beer, 2 Shots of liquor per week  . Drug use: No  . Sexual activity: No    Partners: Male  Other Topics Concern  . 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   No Known Allergies    ROS: As per HPI, remainder of ROS  negative.   OBJECTIVE:   Vitals:   03/05/18 1325  BP: (!) 152/61  Pulse: 92  Resp: 18  Temp: 99.8 F (37.7 C)  SpO2: 100%     General appearance: alert; no distress Eyes: PERRL; EOMI; conjunctiva normal HENT: normocephalic; atraumatic; TMs normal, canal normal, external ears normal without trauma; nasal mucosa normal; oral mucosa normal Neck: supple; enlarged thyroid Lungs: clear to auscultation bilaterally Heart: regular rate and rhythm; soft blowing systolic murmur right sternal border Abdomen: soft, non-tender; bowel sounds normal; no masses or organomegaly; no guarding or rebound tenderness Back: no CVA tenderness Extremities: no cyanosis or edema; symmetrical with no gross deformities Skin: warm and dry Neurologic: normal gait; grossly normal Psychological: alert and cooperative; normal mood and affect      Labs:  Results for orders placed or performed in visit on 01/21/18  TSH  Result Value Ref Range   TSH 0.265 (L) 0.450 - 4.500 uIU/mL    Labs Reviewed - No data to display  No results found.     ASSESSMENT & PLAN:  1. Flu-like symptoms   2. Hyperthyroidism     Meds ordered this encounter  Medications  . ibuprofen (ADVIL,MOTRIN) 800 MG tablet    Sig: Take 1 tablet (800 mg total) by  mouth 3 (three) times daily.    Dispense:  21 tablet    Refill:  0  . oseltamivir (TAMIFLU) 75 MG capsule    Sig: Take 1 capsule (75 mg total) by mouth every 12 (twelve) hours.    Dispense:  10 capsule    Refill:  0    Reviewed expectations re: course of current medical issues. Questions answered. Outlined signs and symptoms indicating need for more acute intervention. Patient verbalized understanding. After Visit Summary given.    Procedures:      Robyn Haber, MD 03/05/18 1356

## 2018-03-24 NOTE — Progress Notes (Signed)
Patient ID: Jasmine Dickson, female   DOB: 04/08/1961, 57 y.o.   MRN: [id number]                 Referring physician:  Chief complaint: Palpitations  History of Present Illness:   She had a routine physical exam in January with her new primary care physician Not clear if there was some concern about her thyroid being enlarged but there was a TSH done and was relatively low, this was repeated about 2 weeks later and was about the same  On questioning patient says that she occasionally feels her heart beating fast but mostly at night She does not complain of any unusual heat intolerance shakiness, nervousness also.  Notes no new symptoms of fatigue and thinks her energy level is fairly good She has been trying to lose weight with exercise and diet because of her diabetes and has lost some weight recently She does not feel a swelling or discomfort/pressure in her neck   Lab Results  Component Value Date   TSH 0.265 (L) 01/21/2018   TSH 0.289 (L) 01/11/2018   TSH 0.896 06/08/2015     Past Medical History:  Diagnosis Date  . Diabetes mellitus without complication ([place])   . HLD (hyperlipidemia)   . Hypertension   . Obesity, Class III, BMI 40-49.9 (morbid obesity) ([place]) 10/07/2002   BMI was 41.9    Past Surgical History:  Procedure Laterality Date  . VAGINAL HYSTERECTOMY  01/19/2000   large fibroid    Family History  Problem Relation Age of Onset  . Diabetes Father   . Hypertension Father   . Hypertension Mother   . Hypertension Sister   . Arthritis Sister   . Diabetes Brother   . Drug abuse Brother   . Diabetes Brother   . Diabetes Maternal Grandmother        breast    Social History:  reports that she has never smoked. She has never used smokeless tobacco. She reports that she drinks about 3.0 oz of alcohol per week. She reports that she does not use drugs.  Allergies: No Known Allergies  Allergies as of 03/25/2018   No Known Allergies     Medication List          Accurate as of 03/25/18  2:22 PM. Always use your most recent med list.          aspirin 81 MG EC tablet Take 81 mg by mouth daily.   cyanocobalamin 1000 MCG tablet Take 1,000 mcg by mouth daily.   hydrochlorothiazide 25 MG tablet Commonly known as:  HYDRODIURIL TAKE 1 TABLET BY MOUTH EVERY DAY   losartan 50 MG tablet Commonly known as:  COZAAR Take 1 tablet (50 mg total) by mouth daily.   metFORMIN 1000 MG tablet Commonly known as:  GLUCOPHAGE TAKE 1 AND 1/2 TABLETS BY MOUTH EVERY MORNING AND 1 TABLET IN THE EVENING       LABS:  No visits with results within 1 Week(s) from this visit.  Latest known visit with results is:  Lab on 01/21/2018  Component Date Value Ref Range Status  . TSH 01/21/2018 0.265* 0.450 - 4.500 uIU/mL Final        Review of Systems  Constitutional: Positive for diaphoresis.  Cardiovascular: Positive for palpitations.       At night mostly, for 1 year  Gastrointestinal: Positive for constipation.  Endocrine: Negative for fatigue and heat intolerance.       Hot flushes present  for the last few years sweating episodes  Skin: Negative for dry skin.  Neurological: Negative for tremors.       May have transient dizziness when turning quickly   DIABETES: Treated with metformin and followed by PCP Only on metformin She does not monitor at home Diabetes history:  Lab Results  Component Value Date   HGBA1C 6.5 01/11/2018   HGBA1C 7.1 12/31/2015   HGBA1C 6.9 06/08/2015   Lab Results  Component Value Date   LDLCALC 109 (H) 01/11/2018   CREATININE 0.73 01/11/2018      PHYSICAL EXAM:  BP 140/70 (BP Location: Left Arm, Patient Position: Sitting, Cuff Size: Large)   Pulse 88   Ht 5\' 5"  (1.651 m)   Wt 211 lb (95.7 kg)   SpO2 98%   BMI 35.11 kg/m   GENERAL: She has generalized obesity present  No pallor, clubbing, lymphadenopathy or edema.   Skin:  no rash or pigmentation.  EYES:  Externally normal.  No lid lag or stare, no  proptosis  ENT: Oral mucosa and tongue normal.  THYROID: Right lobe of thyroid is just palpable about 1-1/2-2 times normal.  She has a firm rubbery enlargement of the isthmus and left lobe of the thyroid especially the isthmus which is clearly palpable.  Left lobe of thyroid is at least twice normal especially medially No stridor heard  HEART:  Normal  S1 and S2; no murmur or click.  CHEST:  Normal shape.  Lungs: Vescicular breath sounds heard equally.  No crepitations/ wheeze.  ABDOMEN:  No distention.  Liver and spleen not palpable.  No other mass or tenderness.  NEUROLOGICAL: .Reflexes are bilaterally normal at biceps   JOINTS:  Normal.   ASSESSMENT:    Goiter with abnormal TSH.  The patient's goiter is not nodular and is mostly left-sided and on the isthmus area.  Not clear how long she has had a goiter.  However her mildly low TSH appears to be relatively new.  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

## 2018-03-25 ENCOUNTER — Ambulatory Visit (INDEPENDENT_AMBULATORY_CARE_PROVIDER_SITE_OTHER): Payer: PRIVATE HEALTH INSURANCE | Admitting: Endocrinology

## 2018-03-25 ENCOUNTER — Encounter: Payer: Self-pay | Admitting: Endocrinology

## 2018-03-25 VITALS — BP 140/70 | HR 88 | Ht 65.0 in | Wt 211.0 lb

## 2018-03-25 DIAGNOSIS — E669 Obesity, unspecified: Secondary | ICD-10-CM

## 2018-03-25 DIAGNOSIS — R7989 Other specified abnormal findings of blood chemistry: Secondary | ICD-10-CM | POA: Diagnosis not present

## 2018-03-25 DIAGNOSIS — E049 Nontoxic goiter, unspecified: Secondary | ICD-10-CM | POA: Diagnosis not present

## 2018-03-25 DIAGNOSIS — E1169 Type 2 diabetes mellitus with other specified complication: Secondary | ICD-10-CM | POA: Diagnosis not present

## 2018-03-25 DIAGNOSIS — Z808 Family history of malignant neoplasm of other organs or systems: Secondary | ICD-10-CM | POA: Insufficient documentation

## 2018-03-25 HISTORY — DX: Family history of malignant neoplasm of other organs or systems: Z80.8

## 2018-03-25 LAB — T4, FREE: Free T4: 1.33 ng/dL (ref 0.60–1.60)

## 2018-03-25 LAB — T3, FREE: T3, Free: 4.2 pg/mL (ref 2.3–4.2)

## 2018-03-25 LAB — GLUCOSE, RANDOM: Glucose, Bld: 150 mg/dL — ABNORMAL HIGH (ref 70–99)

## 2018-03-25 LAB — TSH: TSH: 0.01 u[IU]/mL — AB (ref 0.35–4.50)

## 2018-03-26 ENCOUNTER — Telehealth: Payer: Self-pay | Admitting: Endocrinology

## 2018-03-26 NOTE — Telephone Encounter (Signed)
Patient returning call re: labs-please call her at ph# [phone]

## 2018-03-26 NOTE — Telephone Encounter (Signed)
See result note from 03/25/2018.

## 2018-05-02 ENCOUNTER — Other Ambulatory Visit: Payer: Self-pay | Admitting: Endocrinology

## 2018-05-02 DIAGNOSIS — R7989 Other specified abnormal findings of blood chemistry: Secondary | ICD-10-CM

## 2018-05-31 ENCOUNTER — Ambulatory Visit (HOSPITAL_COMMUNITY)
Admission: RE | Admit: 2018-05-31 | Discharge: 2018-05-31 | Disposition: A | Discharge status: Home / Self Care | Payer: No Typology Code available for payment source | Source: Ambulatory Visit | Attending: Family Medicine | Admitting: Family Medicine

## 2018-05-31 ENCOUNTER — Encounter: Payer: Self-pay | Admitting: Family Medicine

## 2018-05-31 ENCOUNTER — Telehealth: Payer: Self-pay | Admitting: *Deleted

## 2018-05-31 ENCOUNTER — Other Ambulatory Visit: Payer: Self-pay

## 2018-05-31 ENCOUNTER — Telehealth: Payer: Self-pay | Admitting: Endocrinology

## 2018-05-31 ENCOUNTER — Ambulatory Visit (INDEPENDENT_AMBULATORY_CARE_PROVIDER_SITE_OTHER): Payer: PRIVATE HEALTH INSURANCE | Admitting: Family Medicine

## 2018-05-31 VITALS — BP 138/64 | HR 74 | Temp 98.8°F | Ht 65.0 in | Wt 211.4 lb

## 2018-05-31 DIAGNOSIS — H8110 Benign paroxysmal vertigo, unspecified ear: Secondary | ICD-10-CM

## 2018-05-31 DIAGNOSIS — E040 Nontoxic diffuse goiter: Secondary | ICD-10-CM | POA: Diagnosis not present

## 2018-05-31 DIAGNOSIS — R002 Palpitations: Secondary | ICD-10-CM | POA: Diagnosis not present

## 2018-05-31 DIAGNOSIS — Z1231 Encounter for screening mammogram for malignant neoplasm of breast: Secondary | ICD-10-CM | POA: Diagnosis not present

## 2018-05-31 DIAGNOSIS — Z808 Family history of malignant neoplasm of other organs or systems: Secondary | ICD-10-CM

## 2018-05-31 DIAGNOSIS — R000 Tachycardia, unspecified: Secondary | ICD-10-CM | POA: Insufficient documentation

## 2018-05-31 DIAGNOSIS — I440 Atrioventricular block, first degree: Secondary | ICD-10-CM | POA: Diagnosis not present

## 2018-05-31 DIAGNOSIS — Z1239 Encounter for other screening for malignant neoplasm of breast: Secondary | ICD-10-CM

## 2018-05-31 DIAGNOSIS — E1165 Type 2 diabetes mellitus with hyperglycemia: Secondary | ICD-10-CM

## 2018-05-31 DIAGNOSIS — Z0000 Encounter for general adult medical examination without abnormal findings: Secondary | ICD-10-CM | POA: Diagnosis not present

## 2018-05-31 DIAGNOSIS — Z23 Encounter for immunization: Secondary | ICD-10-CM | POA: Diagnosis not present

## 2018-05-31 HISTORY — DX: Palpitations: R00.2

## 2018-05-31 HISTORY — DX: Benign paroxysmal vertigo, unspecified ear: H81.10

## 2018-05-31 LAB — POCT GLYCOSYLATED HEMOGLOBIN (HGB A1C): HbA1c, POC (controlled diabetic range): 6.4 % (ref 0.0–7.0)

## 2018-05-31 MED ORDER — LOSARTAN POTASSIUM 50 MG PO TABS: 50.0000 mg | ORAL_TABLET | Freq: Every day | ORAL | 3 refills | Status: DC

## 2018-05-31 MED ORDER — METFORMIN HCL 1000 MG PO TABS: ORAL_TABLET | ORAL | 1 refills | Status: DC

## 2018-05-31 MED ORDER — HYDROCHLOROTHIAZIDE 25 MG PO TABS: 25.0000 mg | ORAL_TABLET | Freq: Every day | ORAL | 1 refills | Status: DC

## 2018-05-31 MED FILL — LOSARTAN POTASSIUM 50 MG TA: 50 | 90 days supply | Qty: 90 | Fill #0

## 2018-05-31 MED FILL — metFORMIN HCL 1000 MG TABS: 1000 | 90 days supply | Qty: 180 | Fill #0

## 2018-05-31 MED FILL — HYDROCHLOROTHIAZIDE 25 MG T: 25 | 90 days supply | Qty: 90 | Fill #0

## 2018-05-31 NOTE — Assessment & Plan Note (Signed)
Noted.  Needs FU with endo

## 2018-05-31 NOTE — Assessment & Plan Note (Signed)
Assumed to be secondary to thyroid dysfunction.  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  Doesn't appear to be any intra-cardiac etiologies for her tachycardia.   - Doesn't have any symptoms of other rare things like carcinoid or pheo  - anxiety also a possibility but want to rule out any thyroid issues first.

## 2018-05-31 NOTE — Progress Notes (Signed)
Subjective:    Jasmine Dickson is a 57 y.o. female who presents to [facility] today for several issues including diabetes:  1.  Diabetes:  Currently on metformin 1000 mg BID    No adverse effects from medication.  No hypoglycemic events.  No paresthesia or peripheral nerve pain.  No polyuria/polydipsia.  Hasn't missed any doses.  However price of metformin has risen.      Lab Results  Component Value Date   HGBA1C 6.4 05/31/2018    2.  Concerns about her thyroid:  Depressed TSH x 3 on lab checks.  Normal T3/T4.  Goiter diagnosed.  She has been seen at endocrinology and had recommendations for thyroid uptake scan but never had the scan.  Hasn't touched base with them.  Still with some tachycardia/palpitations daily.  No chest pain/dyspnea.  No N/V.  Does have some dizziness, see below.    3.  Vertigo-like symptoms:  Present around the time she was diagnosed with thyroid issues.  Mostly "room-spinning" issues.  Sudden, fleeting.  No falls.  Can happen when turning head or even just sitting still.  Not related to her palpitations.  No dyspnea.  No falls.  Some lightheadedness when standing after having been seated for a while but this is not the same as the vertigo she's been having.    ROS as above per HPI.  Pertinently, no chest pain, palpitations, SOB, Fever, Chills, Abd pain, N/V/D.   The following portions of the patient's history were reviewed and updated as appropriate: allergies, current medications, past medical history, family and social history, and problem list. Patient is a nonsmoker.    PMH reviewed.  Past Medical History:  Diagnosis Date  . Diabetes mellitus without complication ([place])   . HLD (hyperlipidemia)   . Hypertension   . Obesity, Class III, BMI 40-49.9 (morbid obesity) ([place]) 10/07/2002   BMI was 41.9   Past Surgical History:  Procedure Laterality Date  . VAGINAL HYSTERECTOMY  01/19/2000   large fibroid    Medications reviewed. Current Outpatient Medications  Medication  Sig Dispense Refill  . aspirin 81 MG EC tablet Take 81 mg by mouth daily.     . cyanocobalamin 1000 MCG tablet Take 1,000 mcg by mouth daily.    . hydrochlorothiazide (HYDRODIURIL) 25 MG tablet TAKE 1 TABLET BY MOUTH EVERY DAY 90 tablet 1  . losartan (COZAAR) 50 MG tablet Take 1 tablet (50 mg total) by mouth daily. 90 tablet 3  . metFORMIN (GLUCOPHAGE) 1000 MG tablet TAKE 1 AND 1/2 TABLETS BY MOUTH EVERY MORNING AND 1 TABLET IN THE EVENING (Patient taking differently: TAKE 1 TABLET BY MOUTH EVERY MORNING AND 1 TABLET IN THE EVENING) 225 tablet 1   No current facility-administered medications for this visit.      Objective:   Physical Exam BP 138/64   Pulse 74   Temp 98.8 F (37.1 C) (Oral)   Ht 5\' 5"  (1.651 m)   Wt 211 lb 6.4 oz (95.9 kg)   SpO2 98%   BMI 35.18 kg/m  Gen:  Alert, cooperative patient who appears stated age in no acute distress.  Vital signs reviewed. HEENT: EOMI,  MMM Neck:  Goiter noted.   Cardiac:  Regular rate and rhythm with Grade III murmur noted.  Pulm:  Clear to auscultation bilaterally  Abd:  Soft/nondistended/nontender.   Exts: Non edematous BL  LE, warm and well perfused.  Foot exam: No deformities, ulcerations, or other skin breakdown BL feet.  Sensation intact to monofilament  and light touch.  PT and DP pulses intact BL.

## 2018-05-31 NOTE — Patient Instructions (Signed)
It was very good to see you today.  I will refer you to vestibular rehab to work on your dizziness and see if this helps.  Give the endocrinologist to call also consider parathyroid scan.  I have put in an order for mammogram.  They will contact you in the next week or so to schedule this.   Your EKG looks good.  Let us get your thyroid straightened out and I think this will help with your heart rate.  Come back and see me about 2 weeks after see her endocrinologist.

## 2018-05-31 NOTE — Telephone Encounter (Signed)
Pt was called back and given the number for centralized scheduling so she could schedule appt for when it's convenient for her.

## 2018-05-31 NOTE — Telephone Encounter (Signed)
Patient stated she is needing a scan done of her thyroid and as not heard from anyone about scheduling this appt.  Please advise

## 2018-05-31 NOTE — Assessment & Plan Note (Signed)
Better controlled now.  Continue metformin.

## 2018-05-31 NOTE — Assessment & Plan Note (Signed)
Not clear cut diagnosis, but sounds most likely.  Plan referral to vestibular rehab after talking with patient.  May also be related to thyroid dysfunction.

## 2018-05-31 NOTE — Telephone Encounter (Signed)
Tried to contact pt and mailbox is full.  If pt calls back we had her fill out a form to retrieve her last eye exam and she did not sign the form at the bottom. I will have the form up front if you will just ask her to come at her earliest convenience to sign it so we can get this information. Katharina Caper, Hiyab Nhem D, [place]

## 2018-05-31 NOTE — Assessment & Plan Note (Signed)
Mammogram order placed today

## 2018-05-31 NOTE — Assessment & Plan Note (Signed)
Symptomatic.  Depressed TSH.  She is to call and ask about thyroid uptake scan from her endocrinologist.  Tx/management recommendations will depend on that.

## 2018-06-13 ENCOUNTER — Encounter (HOSPITAL_COMMUNITY)
Admission: RE | Admit: 2018-06-13 | Discharge: 2018-06-13 | Disposition: A | Discharge status: Home / Self Care | Payer: Self-pay | Source: Ambulatory Visit | Attending: Endocrinology | Admitting: Endocrinology

## 2018-06-13 ENCOUNTER — Encounter (HOSPITAL_COMMUNITY)
Admission: RE | Admit: 2018-06-13 | Discharge: 2018-06-13 | Disposition: A | Discharge status: Home / Self Care | Payer: PRIVATE HEALTH INSURANCE | Source: Ambulatory Visit | Attending: Endocrinology | Admitting: Endocrinology

## 2018-06-13 DIAGNOSIS — R7989 Other specified abnormal findings of blood chemistry: Secondary | ICD-10-CM | POA: Insufficient documentation

## 2018-06-13 MED ORDER — SODIUM IODIDE I-123 7.4 MBQ CAPS
387.0000 | ORAL_CAPSULE | Freq: Once | ORAL | Status: AC
  Administered 2018-06-13: 387 via ORAL

## 2018-06-14 ENCOUNTER — Encounter (HOSPITAL_COMMUNITY)
Admission: RE | Admit: 2018-06-14 | Discharge: 2018-06-14 | Disposition: A | Discharge status: Home / Self Care | Payer: PRIVATE HEALTH INSURANCE | Source: Ambulatory Visit | Attending: Endocrinology | Admitting: Endocrinology

## 2018-06-17 ENCOUNTER — Other Ambulatory Visit: Payer: Self-pay | Admitting: Endocrinology

## 2018-06-17 ENCOUNTER — Telehealth: Payer: Self-pay | Admitting: Endocrinology

## 2018-06-17 DIAGNOSIS — E0520 Thyrotoxicosis with toxic multinodular goiter without thyrotoxic crisis or storm: Secondary | ICD-10-CM

## 2018-06-17 NOTE — Telephone Encounter (Signed)
Patient ask you to call her concerning her appointment time.  [phone]

## 2018-06-17 NOTE — Telephone Encounter (Signed)
Called pt and left message for her to call office back.

## 2018-06-24 ENCOUNTER — Ambulatory Visit
Admission: RE | Admit: 2018-06-24 | Discharge: 2018-06-24 | Disposition: A | Discharge status: Home / Self Care | Payer: PRIVATE HEALTH INSURANCE | Source: Ambulatory Visit | Attending: Family Medicine | Admitting: Family Medicine

## 2018-06-24 DIAGNOSIS — Z1239 Encounter for other screening for malignant neoplasm of breast: Secondary | ICD-10-CM

## 2018-07-08 ENCOUNTER — Encounter (HOSPITAL_COMMUNITY): Payer: Self-pay | Admitting: Emergency Medicine

## 2018-07-08 ENCOUNTER — Ambulatory Visit (HOSPITAL_COMMUNITY)
Admission: EM | Admit: 2018-07-08 | Discharge: 2018-07-08 | Disposition: A | Discharge status: Home / Self Care | Payer: No Typology Code available for payment source | Attending: Family Medicine | Admitting: Family Medicine

## 2018-07-08 DIAGNOSIS — Z7982 Long term (current) use of aspirin: Secondary | ICD-10-CM | POA: Insufficient documentation

## 2018-07-08 DIAGNOSIS — E1165 Type 2 diabetes mellitus with hyperglycemia: Secondary | ICD-10-CM | POA: Diagnosis not present

## 2018-07-08 DIAGNOSIS — Z113 Encounter for screening for infections with a predominantly sexual mode of transmission: Secondary | ICD-10-CM | POA: Diagnosis not present

## 2018-07-08 DIAGNOSIS — Z7984 Long term (current) use of oral hypoglycemic drugs: Secondary | ICD-10-CM | POA: Insufficient documentation

## 2018-07-08 DIAGNOSIS — Z833 Family history of diabetes mellitus: Secondary | ICD-10-CM | POA: Insufficient documentation

## 2018-07-08 DIAGNOSIS — E785 Hyperlipidemia, unspecified: Secondary | ICD-10-CM | POA: Insufficient documentation

## 2018-07-08 DIAGNOSIS — Z79899 Other long term (current) drug therapy: Secondary | ICD-10-CM | POA: Insufficient documentation

## 2018-07-08 DIAGNOSIS — Z8349 Family history of other endocrine, nutritional and metabolic diseases: Secondary | ICD-10-CM | POA: Insufficient documentation

## 2018-07-08 DIAGNOSIS — Z808 Family history of malignant neoplasm of other organs or systems: Secondary | ICD-10-CM | POA: Insufficient documentation

## 2018-07-08 DIAGNOSIS — Z6841 Body Mass Index (BMI) 40.0 and over, adult: Secondary | ICD-10-CM | POA: Diagnosis not present

## 2018-07-08 DIAGNOSIS — I10 Essential (primary) hypertension: Secondary | ICD-10-CM | POA: Diagnosis not present

## 2018-07-08 DIAGNOSIS — Z8249 Family history of ischemic heart disease and other diseases of the circulatory system: Secondary | ICD-10-CM | POA: Diagnosis not present

## 2018-07-08 DIAGNOSIS — Z202 Contact with and (suspected) exposure to infections with a predominantly sexual mode of transmission: Secondary | ICD-10-CM | POA: Diagnosis present

## 2018-07-08 NOTE — ED Triage Notes (Signed)
Pt here for STD check.

## 2018-07-08 NOTE — Discharge Instructions (Addendum)
We are testing you for Gonorrhea, Chlamydia, Trichomonas, Yeast and Bacterial Vaginosis. We will call you if anything is positive and let you know if you require any further treatment. Please inform partners of any positive results.   Warm compresses/soaks on bump  Please return if symptoms not improving with treatment, development of fever, nausea, vomiting, abdominal pain.

## 2018-07-08 NOTE — ED Provider Notes (Signed)
[facility]    CSN: [id number] Arrival date & time: 07/08/18  [place]     History   Chief Complaint Chief Complaint  Patient presents with  . Exposure to STD    HPI Jasmine Dickson is a 57 y.o. female history of hypertension, hyperlipidemia, DM type II presenting today for evaluation of STD screening.  Patient states that her partner developed burning with urination and was treated with antibiotics.  She does not know which antibiotics or what he was treated for.  His STD screening has not come back yet.  She does note that he has a history of prostate cancer and they may have treated him for a UTI.  She denies any abdominal pain or other symptoms including vaginal discharge, dysuria, increased frequency or pelvic pain.  She does note that she has a small hair bump to her labia, but this is a typical bump for her that will resolve on its own.  HPI  Past Medical History:  Diagnosis Date  . Diabetes mellitus without complication ([place])   . HLD (hyperlipidemia)   . Hypertension   . Obesity, Class III, BMI 40-49.9 (morbid obesity) ([place]) 10/07/2002   BMI was 41.9    Patient Active Problem List   Diagnosis Date Noted  . Palpitations 05/31/2018  . BPPV (benign paroxysmal positional vertigo) 05/31/2018  . Family history of thyroid cancer 03/25/2018  . Preventative health care 05/20/2014  . Sebaceous cyst of breast 05/20/2014  . Inadequate material resources 08/05/2013  . Back pain 09/19/2011  . Tinea pedis 09/19/2011  . Simple goiter 03/08/2010  . Diabetes type 2, uncontrolled ([place]) 02/14/2007  . Dyslipidemia 02/14/2007  . Obesity, Class III, BMI 40-49.9 (morbid obesity) ([place]) 02/14/2007  . HYPERTENSION, BENIGN SYSTEMIC 02/14/2007  . RHINITIS, ALLERGIC 02/14/2007  . Systolic murmur 27/78/2423    Past Surgical History:  Procedure Laterality Date  . VAGINAL HYSTERECTOMY  01/19/2000   large fibroid    OB History   None      Home Medications    Prior to  Admission medications   Medication Sig Start Date End Date Taking? Authorizing Provider  aspirin 81 MG EC tablet Take 81 mg by mouth daily.     [provider]  cyanocobalamin 1000 MCG tablet Take 1,000 mcg by mouth daily.    [provider]  hydrochlorothiazide (HYDRODIURIL) 25 MG tablet Take 1 tablet (25 mg total) by mouth daily. 05/31/18   Alveda Reasons, MD  losartan (COZAAR) 50 MG tablet Take 1 tablet (50 mg total) by mouth daily. 05/31/18   Alveda Reasons, MD  metFORMIN (GLUCOPHAGE) 1000 MG tablet TAKE 1 TABLET BY MOUTH EVERY MORNING AND 1 TABLET IN THE EVENING 05/31/18   Alveda Reasons, MD    Family History Family History  Problem Relation Age of Onset  . Diabetes Father   . Hypertension Father   . Hypertension Mother   . Thyroid disease Mother   . Hypertension Sister   . Arthritis Sister   . Thyroid disease Sister        Thyroid cancer  . Diabetes Brother   . Drug abuse Brother   . Diabetes Brother   . Diabetes Maternal Grandmother        breast  . Thyroid disease Sister   . Breast cancer Neg Hx     Social History Social History   Tobacco Use  . Smoking status: Never Smoker  . Smokeless tobacco: Never Used  Substance Use Topics  .  Alcohol use: Yes    Alcohol/week: 3.0 oz    Types: 3 Cans of beer, 2 Shots of liquor per week  . Drug use: No     Allergies   Patient has no known allergies.   Review of Systems Review of Systems  Constitutional: Negative for fever.  Respiratory: Negative for shortness of breath.   Cardiovascular: Negative for chest pain.  Gastrointestinal: Negative for abdominal pain, diarrhea, nausea and vomiting.  Genitourinary: Negative for dysuria, flank pain, genital sores, hematuria, menstrual problem, vaginal bleeding, vaginal discharge and vaginal pain.  Musculoskeletal: Negative for back pain.  Skin: Negative for rash.  Neurological: Negative for dizziness, light-headedness and headaches.     Physical  Exam Triage Vital Signs ED Triage Vitals  Enc Vitals Group     BP 07/08/18 1759 (!) 156/70     Pulse Rate 07/08/18 1759 81     Resp 07/08/18 1759 18     Temp 07/08/18 1759 98.2 F (36.8 C)     Temp Source 07/08/18 1759 Oral     SpO2 07/08/18 1759 100 %     Weight --      Height --      Head Circumference --      Peak Flow --      Pain Score 07/08/18 1809 0     Pain Loc --      Pain Edu? --      Excl. in [place]? --    No data found.  Updated Vital Signs BP (!) 156/70 (BP Location: Left Arm)   Pulse 81   Temp 98.2 F (36.8 C) (Oral)   Resp 18   SpO2 100%   Visual Acuity Right Eye Distance:   Left Eye Distance:   Bilateral Distance:    Right Eye Near:   Left Eye Near:    Bilateral Near:     Physical Exam  Constitutional: She is oriented to Dickson, place, and time. She appears well-developed and well-nourished.  No acute distress  HENT:  Head: Normocephalic and atraumatic.  Nose: Nose normal.  Eyes: Conjunctivae are normal.  Neck: Neck supple.  Cardiovascular: Normal rate.  Pulmonary/Chest: Effort normal. No respiratory distress.  Abdominal: She exhibits no distension.  Genitourinary:  Genitourinary Comments: Small folliculitis/abscess to right labia majora, mild tenderness to palpation  Musculoskeletal: Normal range of motion.  Neurological: She is alert and oriented to Dickson, place, and time.  Skin: Skin is warm and dry.  Psychiatric: She has a normal mood and affect.  Nursing note and vitals reviewed.    UC Treatments / Results  Labs (all labs ordered are listed, but only abnormal results are displayed) Labs Reviewed  CERVICOVAGINAL ANCILLARY ONLY    EKG None  Radiology No results found.  Procedures Procedures (including critical care time)  Medications Ordered in UC Medications - No data to display  Initial Impression / Assessment and Plan / UC Course  I have reviewed the triage vital signs and the nursing notes.  Pertinent labs & imaging  results that were available during my care of the patient were reviewed by me and considered in my medical decision making (see chart for details).     Vaginal swab obtained, will send off to check for STDs.  Offered patient empiric treatment today, but after further discussion she opted to hold off on treatment and wait for results.  Warm compresses to area on labia, this does not appear to herpes-like or concerning for other STD lesions.Discussed strict return precautions.  Patient verbalized understanding and is agreeable with plan.  Final Clinical Impressions(s) / UC Diagnoses   Final diagnoses:  Screen for STD (sexually transmitted disease)     Discharge Instructions     We are testing you for Gonorrhea, Chlamydia, Trichomonas, Yeast and Bacterial Vaginosis. We will call you if anything is positive and let you know if you require any further treatment. Please inform partners of any positive results.   Warm compresses/soaks on bump  Please return if symptoms not improving with treatment, development of fever, nausea, vomiting, abdominal pain.     ED Prescriptions    None     Controlled Substance Prescriptions [place] Controlled Substance Registry consulted? Not Applicable   Janith Lima, [place] 07/08/18 1829

## 2018-07-09 LAB — CERVICOVAGINAL ANCILLARY ONLY
BACTERIAL VAGINITIS: POSITIVE — AB
Candida vaginitis: NEGATIVE
Chlamydia: NEGATIVE
NEISSERIA GONORRHEA: NEGATIVE
Trichomonas: NEGATIVE

## 2018-07-12 ENCOUNTER — Encounter: Payer: Self-pay | Admitting: Family Medicine

## 2018-07-13 ENCOUNTER — Telehealth (HOSPITAL_COMMUNITY): Payer: Self-pay

## 2018-07-13 MED ORDER — METRONIDAZOLE 500 MG PO TABS: 500.0000 mg | ORAL_TABLET | Freq: Two times a day (BID) | ORAL | 0 refills | Status: DC

## 2018-07-13 NOTE — Telephone Encounter (Signed)
Pt came to clinic for test results. Bacterial vaginosis is positive. This was not treated at the urgent care visit.  Patient complains of persistent symptoms.  Flagyl 500 mg BID x 7 days #14 no refills sent to patients pharmacy of choice per Dr. Valere Dross.

## 2018-07-23 ENCOUNTER — Other Ambulatory Visit (INDEPENDENT_AMBULATORY_CARE_PROVIDER_SITE_OTHER): Payer: PRIVATE HEALTH INSURANCE

## 2018-07-23 DIAGNOSIS — E0520 Thyrotoxicosis with toxic multinodular goiter without thyrotoxic crisis or storm: Secondary | ICD-10-CM

## 2018-07-23 LAB — TSH

## 2018-07-23 LAB — T3, FREE: T3, Free: 4.8 pg/mL — ABNORMAL HIGH (ref 2.3–4.2)

## 2018-07-23 LAB — T4, FREE: FREE T4: 1.59 ng/dL (ref 0.60–1.60)

## 2018-07-29 ENCOUNTER — Encounter: Payer: Self-pay | Admitting: Endocrinology

## 2018-07-29 ENCOUNTER — Ambulatory Visit (INDEPENDENT_AMBULATORY_CARE_PROVIDER_SITE_OTHER): Payer: PRIVATE HEALTH INSURANCE | Admitting: Endocrinology

## 2018-07-29 VITALS — BP 138/82 | HR 79 | Ht 65.0 in | Wt 207.8 lb

## 2018-07-29 DIAGNOSIS — E0520 Thyrotoxicosis with toxic multinodular goiter without thyrotoxic crisis or storm: Secondary | ICD-10-CM | POA: Diagnosis not present

## 2018-07-29 MED ORDER — METHIMAZOLE 5 MG PO TABS: 5.0000 mg | ORAL_TABLET | Freq: Two times a day (BID) | ORAL | 1 refills | Status: DC

## 2018-07-29 MED FILL — methIMAzole 5 MG TABS: 5 | 30 days supply | Qty: 60 | Fill #0

## 2018-07-29 NOTE — Progress Notes (Signed)
Patient ID: Jasmine Dickson, female   DOB: [birth year], 57 y.o.   MRN: [id number]                   Chief complaint: Palpitations  History of Present Illness:   She had a routine physical exam in January with her new primary care physician and TSH was low   She said that she feels her heart beating fast, somewhat more at night and this can be either a few rapid beats or some skipping but not lasting all the time She does not complain of any shakiness, nervousness but appears to have lost 4 pounds She is trying to lose a little weight because of her diabetes Does not think she has any unusual fatigue except occasionally She does periodically feel warm and has hot flashes.    Thyroid functions showed subclinical hyperthyroidism in April 2019  Since her exam showed a goiter mostly left-sided she had a thyroid scan done in June, this had not been scheduled prior to this This shows hydrogenous uptake in the thyroid scan and 24 uptake of 38%  Thyroid function now show that her free T3 level has gone up to 4.8, previously upper normal at 4.2  She is now here for further discussion about treatment  Lab Results  Component Value Date   TSH <0.01 (L) 07/23/2018   TSH 0.01 (L) 03/25/2018   TSH 0.265 (L) 01/21/2018   FREET4 1.59 07/23/2018   FREET4 1.33 03/25/2018   Wt Readings from Last 3 Encounters:  07/29/18 207 lb 12.8 oz (94.3 kg)  05/31/18 211 lb 6.4 oz (95.9 kg)  03/25/18 211 lb (95.7 kg)     Past Medical History:  Diagnosis Date  . Diabetes mellitus without complication ([place])   . HLD (hyperlipidemia)   . Hypertension   . Obesity, Class III, BMI 40-49.9 (morbid obesity) ([place]) 10/07/2002   BMI was 41.9    Past Surgical History:  Procedure Laterality Date  . VAGINAL HYSTERECTOMY  01/19/2000   large fibroid    Family History  Problem Relation Age of Onset  . Diabetes Father   . Hypertension Father   . Hypertension Mother   . Thyroid disease Mother   . Hypertension  Sister   . Arthritis Sister   . Thyroid disease Sister        Thyroid cancer  . Diabetes Brother   . Drug abuse Brother   . Diabetes Brother   . Diabetes Maternal Grandmother        breast  . Thyroid disease Sister   . Breast cancer Neg Hx     Social History:  reports that she has never smoked. She has never used smokeless tobacco. She reports that she drinks about 5.0 standard drinks of alcohol per week. She reports that she does not use drugs.  Allergies: No Known Allergies  Allergies as of 07/29/2018   No Known Allergies     Medication List        Accurate as of 07/29/18 10:12 AM. Always use your most recent med list.          aspirin 81 MG EC tablet Take 81 mg by mouth daily.   hydrochlorothiazide 25 MG tablet Commonly known as:  HYDRODIURIL Take 1 tablet (25 mg total) by mouth daily.   losartan 50 MG tablet Commonly known as:  COZAAR Take 1 tablet (50 mg total) by mouth daily.   metFORMIN 1000 MG tablet Commonly known as:  GLUCOPHAGE TAKE 1  TABLET BY MOUTH EVERY MORNING AND 1 TABLET IN THE EVENING       LABS:  Appointment on 07/23/2018  Component Date Value Ref Range Status  . T3, Free 07/23/2018 4.8* 2.3 - 4.2 pg/mL Final  . Free T4 07/23/2018 1.59  0.60 - 1.60 ng/dL Final   Comment: Specimens from patients who are undergoing biotin therapy and /or ingesting biotin supplements may contain high levels of biotin.  The higher biotin concentration in these specimens interferes with this Free T4 assay.  Specimens that contain high levels  of biotin may cause false high results for this Free T4 assay.  Please interpret results in light of the total clinical presentation of the patient.    Marland Kitchen TSH 07/23/2018 <0.01* 0.35 - 4.50 uIU/mL Final        Review of Systems   DIABETES: Treated with metformin and followed by PCP Only on metformin currently  She does not monitor at home Diabetes history:  Lab Results  Component Value Date   HGBA1C 6.4 05/31/2018     HGBA1C 6.5 01/11/2018   HGBA1C 7.1 12/31/2015   Lab Results  Component Value Date   LDLCALC 109 (H) 01/11/2018   CREATININE 0.73 01/11/2018      PHYSICAL EXAM:  BP 138/82 (BP Location: Left Arm, Patient Position: Sitting, Cuff Size: Normal)   Pulse 79   Ht 5\' 5"  (1.651 m)   Wt 207 lb 12.8 oz (94.3 kg)   SpO2 97%   BMI 34.58 kg/m   No prominence of the eyes   THYROID: Right lobe of thyroid is not clearly palpable Left lobe is enlarged 2-2.5 times normal, firm and slightly irregular No discrete nodules felt No lymphadenopathy  Reflexes are difficult to elicit but but not apparently hyperactive No tremor Hands are not diaphoretic    ASSESSMENT:   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 She does have mostly left-sided goiter On scan she has heterogenous uptake and unlikely that she has Graves' disease  Discussed diagnosis of hyperthyroidism and management options with her current studies showing multinodular goiter Her thyroid levels are increasing and since T3 level is above normal she does need to be treated   PLAN:   Since she does have some tendency to tachycardia will need to treat her with methimazole prior to scheduling I-131 treatment Discussed how I-131 treatment is done and course of thyroid functions after this is done  She will start with methimazole 5 mg twice daily and follow-up in about a month  Trevian Hayashida 07/29/2018, 10:12 AM

## 2018-07-29 NOTE — Patient Instructions (Signed)
COMMON QUESTIONS ABOUT RADIOACTIVE IODINE TREATMENT   Why is radioactive iodine used?  Radioactive iodine is a very common option for treating an overactive thyroid. Normally the thyroid gland uses iodine to make thyroid hormone. An overactive thyroid gland extracts the iodine more completely from the bloodstream. When radioactive iodine is given orally most of it is retained within the overactive thyroid.  The concentrated radioactivity slowly destroys the thyroid cells and controls the over activity.  Because the radioactive rays travel only a very short distance other organs are not affected.  Thus the radioactive iodine works in a targeted manner effectively and safely.   How is radioactive iodine given?  It is usually given as a capsule in a single dose.  First, a very small test dose of radioactive iodine is given and the amount retained in the thyroid is measured the next day with a special counter.  This helps [place] calculate the dose of radioactive iodine to be given for treatment.  The radioactive iodine is given under the supervision of a Radiologist with the proper precautions.  Since the effective dose of the radioactive iodine is approximate, rarely one may need a second dose to control the over activity.Thus the treatment is extremely simple to take.  What happens after the radioactive iodine is given?  There is a gradual reduction in the over activity of the thyroid.  Initially it takes time to get rid of the excess thyroid hormone already present in the body.  With the slow destruction of the thyroid cells the high levels start coming down after 2 to 3 weeks.  It may take up to 8 weeks to completely control the thyroid.  Usually most of the thyroid cells are destroyed and thyroid levels start getting low by two months.  However this will vary from patient to patient and the thyroid levels may remain normal for some time.  It is very important to have regular follow-up after the treatment.   Once the thyroid levels get low you will be started on a thyroid supplement, taken once daily for life.    Are there any side effects of radioactive iodine?  Generally no side effects are encountered.  Rarely one may have discomfort and swelling in the thyroid gland for a few days.  This should be reported if there is significant pain.  The radiation exposure to internal organs from radioactive iodine is no more than in a kidney x-ray or barium studies.  There are no effects on reproductive organs but women who are pregnant or nursing should not take radioactive iodine.  No long-term effects including cancer have been seen.

## 2018-08-30 ENCOUNTER — Other Ambulatory Visit (INDEPENDENT_AMBULATORY_CARE_PROVIDER_SITE_OTHER): Payer: No Typology Code available for payment source

## 2018-08-30 DIAGNOSIS — E0520 Thyrotoxicosis with toxic multinodular goiter without thyrotoxic crisis or storm: Secondary | ICD-10-CM

## 2018-08-30 LAB — T3, FREE: T3 FREE: 3.1 pg/mL (ref 2.3–4.2)

## 2018-08-30 LAB — T4, FREE: FREE T4: 0.65 ng/dL (ref 0.60–1.60)

## 2018-08-30 LAB — TSH: TSH: 0.01 u[IU]/mL — AB (ref 0.35–4.50)

## 2018-09-03 ENCOUNTER — Encounter: Payer: Self-pay | Admitting: Endocrinology

## 2018-09-03 ENCOUNTER — Ambulatory Visit (INDEPENDENT_AMBULATORY_CARE_PROVIDER_SITE_OTHER): Payer: No Typology Code available for payment source | Admitting: Endocrinology

## 2018-09-03 VITALS — BP 120/70 | HR 84 | Temp 98.2°F | Resp 16 | Ht 65.0 in | Wt 212.8 lb

## 2018-09-03 DIAGNOSIS — E0520 Thyrotoxicosis with toxic multinodular goiter without thyrotoxic crisis or storm: Secondary | ICD-10-CM

## 2018-09-03 NOTE — Patient Instructions (Signed)
Take Rx for 2 weeks, 3rd day after Rx

## 2018-09-03 NOTE — Progress Notes (Signed)
Patient ID: Jasmine Dickson, female   DOB: [birth year], 57 y.o.   MRN: [id number]                   Chief complaint: Follow-up of thyroid  History of Present Illness:   She had a routine physical exam in January with her new primary care physician and TSH was low  Baseline hyperthyroid symptoms: She had reported her heart beating fast, somewhat more at night and this can be either a few rapid beats or some skipping but not lasting all the time She does not complain of any shakiness, nervousness She has been trying to lose a little weight because of her diabetes Does not think she has any unusual fatigue except occasionally She does periodically feel warm and has hot flashes.    Thyroid functions showed subclinical hyperthyroidism in April 2019  Since her exam showed a goiter mostly left-sided she had a thyroid scan done in June, this had not been scheduled prior to this This shows hydrogenous uptake in the thyroid scan and 24 uptake of 38%  Since her free T3 level had gone up to 4.8, previously upper normal at 4.2 she was started on methimazole 5 mg twice daily in 8/19  With this she says she feels much better with no further palpitations, less heat intolerance, better energy level and less weight loss   Lab Results  Component Value Date   TSH 0.01 (L) 08/30/2018   TSH <0.01 (L) 07/23/2018   TSH 0.01 (L) 03/25/2018   FREET4 0.65 08/30/2018   FREET4 1.59 07/23/2018   FREET4 1.33 03/25/2018   Wt Readings from Last 3 Encounters:  09/03/18 212 lb 12.8 oz (96.5 kg)  07/29/18 207 lb 12.8 oz (94.3 kg)  05/31/18 211 lb 6.4 oz (95.9 kg)     Past Medical History:  Diagnosis Date  . Diabetes mellitus without complication ([place])   . HLD (hyperlipidemia)   . Hypertension   . Obesity, Class III, BMI 40-49.9 (morbid obesity) ([place]) 10/07/2002   BMI was 41.9    Past Surgical History:  Procedure Laterality Date  . VAGINAL HYSTERECTOMY  01/19/2000   large fibroid    Family History    Problem Relation Age of Onset  . Diabetes Father   . Hypertension Father   . Hypertension Mother   . Thyroid disease Mother   . Hypertension Sister   . Arthritis Sister   . Thyroid disease Sister        Thyroid cancer  . Diabetes Brother   . Drug abuse Brother   . Diabetes Brother   . Diabetes Maternal Grandmother        breast  . Thyroid disease Sister   . Breast cancer Neg Hx     Social History:  reports that she has never smoked. She has never used smokeless tobacco. She reports that she drinks about 5.0 standard drinks of alcohol per week. She reports that she does not use drugs.  Allergies: No Known Allergies  Allergies as of 09/03/2018   No Known Allergies     Medication List        Accurate as of 09/03/18  3:48 PM. Always use your most recent med list.          aspirin 81 MG EC tablet Take 81 mg by mouth daily.   hydrochlorothiazide 25 MG tablet Commonly known as:  HYDRODIURIL Take 1 tablet (25 mg total) by mouth daily.   losartan 50 MG tablet Commonly  known as:  COZAAR Take 1 tablet (50 mg total) by mouth daily.   metFORMIN 1000 MG tablet Commonly known as:  GLUCOPHAGE TAKE 1 TABLET BY MOUTH EVERY MORNING AND 1 TABLET IN THE EVENING   methimazole 5 MG tablet Commonly known as:  TAPAZOLE Take 1 tablet (5 mg total) by mouth 2 (two) times daily.       LABS:  Lab on 08/30/2018  Component Date Value Ref Range Status  . TSH 08/30/2018 0.01* 0.35 - 4.50 uIU/mL Final  . T3, Free 08/30/2018 3.1  2.3 - 4.2 pg/mL Final  . Free T4 08/30/2018 0.65  0.60 - 1.60 ng/dL Final   Comment: Specimens from patients who are undergoing biotin therapy and /or ingesting biotin supplements may contain high levels of biotin.  The higher biotin concentration in these specimens interferes with this Free T4 assay.  Specimens that contain high levels  of biotin may cause false high results for this Free T4 assay.  Please interpret results in light of the total clinical  presentation of the patient.          Review of Systems DIABETES: Treated with metformin and followed by PCP Only on metformin   She does not monitor at home Diabetes history:  Lab Results  Component Value Date   HGBA1C 6.4 05/31/2018   HGBA1C 6.5 01/11/2018   HGBA1C 7.1 12/31/2015   Lab Results  Component Value Date   LDLCALC 109 (H) 01/11/2018   CREATININE 0.73 01/11/2018      PHYSICAL EXAM:  BP 120/70 (BP Location: Left Arm, Cuff Size: Large)   Pulse 84   Temp 98.2 F (36.8 C) (Oral)   Resp 16   Ht 5\' 5"  (1.651 m)   Wt 212 lb 12.8 oz (96.5 kg)   BMI 35.41 kg/m    THYROID: Right lobe of thyroid is about 1-1/2 times normal and smooth and slightly firm  Left lobe is enlarged about 2 times normal, firm and slightly irregular  Biceps reflexes are normal No peripheral edema    ASSESSMENT:   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 symptoms of intermittent palpitations Her thyroid enlargement appears relatively smaller on the left On scan she has heterogenous uptake and I-131 24 uptake is 38%  With using methimazole her symptoms have improved and her thyroid levels are back to normal  With normalization of her thyroid level she is not ready for I-131 treatment Discussed how this would be done and time course of action  PLAN:   She is scheduled for I-131 treatment in 10 days and she will not restart methimazole until 3 days after and take it for 2 weeks Follow-up in about 6 weeks  Patient Instructions  Take Rx for 2 weeks, 3rd day after Rx    Elayne Snare 09/03/2018, 3:48 PM

## 2018-09-09 ENCOUNTER — Encounter: Payer: Self-pay | Admitting: Family Medicine

## 2018-09-12 MED ORDER — HYDROCHLOROTHIAZIDE 25 MG PO TABS: 25.0000 mg | ORAL_TABLET | Freq: Every day | ORAL | 1 refills | Status: DC

## 2018-09-12 MED ORDER — LOSARTAN POTASSIUM 50 MG PO TABS: 50.0000 mg | ORAL_TABLET | Freq: Every day | ORAL | 3 refills | Status: DC

## 2018-09-12 MED ORDER — METFORMIN HCL 1000 MG PO TABS: ORAL_TABLET | ORAL | 1 refills | Status: DC

## 2018-09-12 MED FILL — HYDROCHLOROTHIAZIDE 25 MG T: 25 | 90 days supply | Qty: 90 | Fill #0

## 2018-09-12 MED FILL — metFORMIN HCL 1000 MG TABS: 1000 | 90 days supply | Qty: 180 | Fill #0

## 2018-09-12 MED FILL — LOSARTAN POTASSIUM 50 MG TA: 50 | 90 days supply | Qty: 90 | Fill #0

## 2018-09-12 NOTE — Telephone Encounter (Signed)
My chart message received regarding medications. New prescription sent to pharmacy.  Reviewed metabolic panel and most recent notes from Dr. Mingo Amber.

## 2018-09-13 ENCOUNTER — Ambulatory Visit (HOSPITAL_COMMUNITY)
Admission: RE | Admit: 2018-09-13 | Discharge: 2018-09-13 | Disposition: A | Discharge status: Home / Self Care | Payer: No Typology Code available for payment source | Source: Ambulatory Visit | Attending: Endocrinology | Admitting: Endocrinology

## 2018-09-13 DIAGNOSIS — E0520 Thyrotoxicosis with toxic multinodular goiter without thyrotoxic crisis or storm: Secondary | ICD-10-CM | POA: Diagnosis present

## 2018-09-13 MED ORDER — SODIUM IODIDE I 131 CAPSULE
30.0000 | Freq: Once | INTRAVENOUS | Status: AC | PRN
  Administered 2018-09-13: 30 via ORAL

## 2018-09-19 ENCOUNTER — Other Ambulatory Visit: Payer: Self-pay | Admitting: Endocrinology

## 2018-09-19 MED ORDER — METHIMAZOLE 5 MG PO TABS: 5.0000 mg | ORAL_TABLET | Freq: Two times a day (BID) | ORAL | 0 refills | Status: DC

## 2018-09-19 MED FILL — methIMAzole 5 MG TABS: 5 | 15 days supply | Qty: 30 | Fill #0

## 2018-10-22 ENCOUNTER — Other Ambulatory Visit (INDEPENDENT_AMBULATORY_CARE_PROVIDER_SITE_OTHER): Payer: No Typology Code available for payment source

## 2018-10-22 DIAGNOSIS — E0520 Thyrotoxicosis with toxic multinodular goiter without thyrotoxic crisis or storm: Secondary | ICD-10-CM | POA: Diagnosis not present

## 2018-10-22 LAB — T3, FREE: T3 FREE: 2.9 pg/mL (ref 2.3–4.2)

## 2018-10-22 LAB — T4, FREE: FREE T4: 0.65 ng/dL (ref 0.60–1.60)

## 2018-10-24 NOTE — Progress Notes (Signed)
Patient ID: Jasmine Dickson, female   DOB: October 22, 1961, 57 y.o.   MRN: [id number]                   Chief complaint: Follow-up of thyroid  History of Present Illness:   She had a routine physical exam in January with her new primary care physician and TSH was low  Baseline hyperthyroid symptoms: She had reported her heart beating fast, somewhat more at night and this can be either a few rapid beats or some skipping but not lasting all the time She does not complain of any shakiness, nervousness She has been trying to lose a little weight because of her diabetes Does not think she has any unusual fatigue except occasionally She does periodically feel warm and has hot flashes.    RECENT history: Since her exam showed a goiter, mostly left-sided she had a thyroid scan done in June, This shows heterogenous uptake in the thyroid scan and 24 uptake of 38%  Since her free T3 level had gone up to 4.8, previously upper normal at 4.2 she was started on methimazole 5 mg twice daily in 8/19 She apparently had her I-131 treatment with 31.6 mCi on 09/13/2018  She was continued on methimazole for 2 weeks after her treatment She does not complain of any palpitations, heat intolerance or fatigue as much Her weight has leveled off Recently also she does not complain of feeling sluggish or cold   Lab Results  Component Value Date   TSH 0.01 (L) 08/30/2018   TSH <0.01 (L) 07/23/2018   TSH 0.01 (L) 03/25/2018   FREET4 0.65 10/22/2018   FREET4 0.65 08/30/2018   FREET4 1.59 07/23/2018   Wt Readings from Last 3 Encounters:  10/25/18 212 lb (96.2 kg)  09/03/18 212 lb 12.8 oz (96.5 kg)  07/29/18 207 lb 12.8 oz (94.3 kg)     Past Medical History:  Diagnosis Date  . Diabetes mellitus without complication ([place])   . HLD (hyperlipidemia)   . Hypertension   . Obesity, Class III, BMI 40-49.9 (morbid obesity) ([place]) 10/07/2002   BMI was 41.9    Past Surgical History:  Procedure Laterality Date  .  VAGINAL HYSTERECTOMY  01/19/2000   large fibroid    Family History  Problem Relation Age of Onset  . Diabetes Father   . Hypertension Father   . Hypertension Mother   . Thyroid disease Mother   . Hypertension Sister   . Arthritis Sister   . Thyroid disease Sister        Thyroid cancer  . Diabetes Brother   . Drug abuse Brother   . Diabetes Brother   . Diabetes Maternal Grandmother        breast  . Thyroid disease Sister   . Breast cancer Neg Hx     Social History:  reports that she has never smoked. She has never used smokeless tobacco. She reports that she drinks about 5.0 standard drinks of alcohol per week. She reports that she does not use drugs.  Allergies: No Known Allergies  Allergies as of 10/25/2018   No Known Allergies     Medication List        Accurate as of 10/25/18  8:43 AM. Always use your most recent med list.          aspirin 81 MG EC tablet Take 81 mg by mouth daily.   hydrochlorothiazide 25 MG tablet Commonly known as:  HYDRODIURIL Take 1 tablet (25 mg  total) by mouth daily.   losartan 50 MG tablet Commonly known as:  COZAAR Take 1 tablet (50 mg total) by mouth daily.   metFORMIN 1000 MG tablet Commonly known as:  GLUCOPHAGE TAKE 1 TABLET BY MOUTH EVERY MORNING AND 1 TABLET IN THE EVENING   methimazole 5 MG tablet Commonly known as:  TAPAZOLE Take 1 tablet (5 mg total) by mouth 2 (two) times daily. Stop medication after 2 weeks       LABS:  Lab on 10/22/2018  Component Date Value Ref Range Status  . T3, Free 10/22/2018 2.9  2.3 - 4.2 pg/mL Final  . Free T4 10/22/2018 0.65  0.60 - 1.60 ng/dL Final   Comment: Specimens from patients who are undergoing biotin therapy and /or ingesting biotin supplements may contain high levels of biotin.  The higher biotin concentration in these specimens interferes with this Free T4 assay.  Specimens that contain high levels  of biotin may cause false high results for this Free T4 assay.  Please  interpret results in light of the total clinical presentation of the patient.          Review of Systems DIABETES: Treated with metformin and followed by PCP Only on metformin   She does not monitor at home Diabetes history:  Lab Results  Component Value Date   HGBA1C 6.4 05/31/2018   HGBA1C 6.5 01/11/2018   HGBA1C 7.1 12/31/2015   Lab Results  Component Value Date   LDLCALC 109 (H) 01/11/2018   CREATININE 0.73 01/11/2018      PHYSICAL EXAM:  BP 130/72   Pulse 87   Resp 16   Ht 5\' 5"  (1.651 m)   Wt 212 lb (96.2 kg)   SpO2 98%   BMI 35.28 kg/m    THYROID: Right lobe of thyroid is not clearly palpable  Left lobe is enlarged about 2 times normal, firm  Biceps reflexes are normal No peripheral edema    ASSESSMENT:   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   Elayne Snare 10/25/2018, 8:43 AM

## 2018-10-25 ENCOUNTER — Encounter: Payer: Self-pay | Admitting: Endocrinology

## 2018-10-25 ENCOUNTER — Ambulatory Visit (INDEPENDENT_AMBULATORY_CARE_PROVIDER_SITE_OTHER): Payer: No Typology Code available for payment source | Admitting: Endocrinology

## 2018-10-25 VITALS — BP 130/72 | HR 87 | Resp 16 | Ht 65.0 in | Wt 212.0 lb

## 2018-10-25 DIAGNOSIS — E0520 Thyrotoxicosis with toxic multinodular goiter without thyrotoxic crisis or storm: Secondary | ICD-10-CM

## 2018-12-15 ENCOUNTER — Emergency Department (HOSPITAL_COMMUNITY): Payer: No Typology Code available for payment source

## 2018-12-15 ENCOUNTER — Emergency Department (HOSPITAL_COMMUNITY)
Admission: EM | Admit: 2018-12-15 | Discharge: 2018-12-15 | Disposition: A | Discharge status: Home / Self Care | Payer: No Typology Code available for payment source | Attending: Emergency Medicine | Admitting: Emergency Medicine

## 2018-12-15 ENCOUNTER — Other Ambulatory Visit: Payer: Self-pay

## 2018-12-15 DIAGNOSIS — E119 Type 2 diabetes mellitus without complications: Secondary | ICD-10-CM | POA: Diagnosis not present

## 2018-12-15 DIAGNOSIS — I10 Essential (primary) hypertension: Secondary | ICD-10-CM | POA: Insufficient documentation

## 2018-12-15 DIAGNOSIS — Y998 Other external cause status: Secondary | ICD-10-CM | POA: Insufficient documentation

## 2018-12-15 DIAGNOSIS — W208XXA Other cause of strike by thrown, projected or falling object, initial encounter: Secondary | ICD-10-CM | POA: Diagnosis not present

## 2018-12-15 DIAGNOSIS — S99921A Unspecified injury of right foot, initial encounter: Secondary | ICD-10-CM | POA: Diagnosis present

## 2018-12-15 DIAGNOSIS — Y929 Unspecified place or not applicable: Secondary | ICD-10-CM | POA: Diagnosis not present

## 2018-12-15 DIAGNOSIS — S9031XA Contusion of right foot, initial encounter: Secondary | ICD-10-CM | POA: Insufficient documentation

## 2018-12-15 DIAGNOSIS — Z7982 Long term (current) use of aspirin: Secondary | ICD-10-CM | POA: Insufficient documentation

## 2018-12-15 DIAGNOSIS — Y939 Activity, unspecified: Secondary | ICD-10-CM | POA: Insufficient documentation

## 2018-12-15 DIAGNOSIS — Z79899 Other long term (current) drug therapy: Secondary | ICD-10-CM | POA: Insufficient documentation

## 2018-12-15 DIAGNOSIS — Z7984 Long term (current) use of oral hypoglycemic drugs: Secondary | ICD-10-CM | POA: Insufficient documentation

## 2018-12-15 DIAGNOSIS — E785 Hyperlipidemia, unspecified: Secondary | ICD-10-CM | POA: Insufficient documentation

## 2018-12-15 NOTE — ED Triage Notes (Signed)
Pt presents to ED with right medial foot pain. She dropped a 10 lb bucket on it two weeks ago and the pain has not gotten any better.

## 2018-12-15 NOTE — ED Provider Notes (Signed)
[facility] EMERGENCY DEPARTMENT Provider Note   CSN: [id number] Arrival date & time: 12/15/18  1017     History   Chief Complaint Chief Complaint  Patient presents with  . Foot Injury    HPI Jasmine Dickson is a 57 y.o. female with past medical history of hypertension, type 2 diabetes, presenting to the emergency department with complaint of acute onset of right foot pain that began 2 weeks ago.  Patient states she dropped a 10 pound bucket on the top of her foot and has had persistent pain in that area since that time.  Pain is worse with walking and palpation.  She has been treating with ibuprofen.  No wounds.  No other complaints.  The history is provided by the patient.    Past Medical History:  Diagnosis Date  . Diabetes mellitus without complication ([place])   . HLD (hyperlipidemia)   . Hypertension   . Obesity, Class III, BMI 40-49.9 (morbid obesity) ([place]) 10/07/2002   BMI was 41.9    Patient Active Problem List   Diagnosis Date Noted  . Palpitations 05/31/2018  . BPPV (benign paroxysmal positional vertigo) 05/31/2018  . Family history of thyroid cancer 03/25/2018  . Preventative health care 05/20/2014  . Sebaceous cyst of breast 05/20/2014  . Inadequate material resources 08/05/2013  . Back pain 09/19/2011  . Tinea pedis 09/19/2011  . Simple goiter 03/08/2010  . Diabetes type 2, uncontrolled ([place]) 02/14/2007  . Dyslipidemia 02/14/2007  . Obesity, Class III, BMI 40-49.9 (morbid obesity) ([place]) 02/14/2007  . HYPERTENSION, BENIGN SYSTEMIC 02/14/2007  . RHINITIS, ALLERGIC 02/14/2007  . Systolic murmur 61/60/7371    Past Surgical History:  Procedure Laterality Date  . VAGINAL HYSTERECTOMY  01/19/2000   large fibroid     OB History   No obstetric history on file.      Home Medications    Prior to Admission medications   Medication Sig Start Date End Date Taking? Authorizing Provider  aspirin 81 MG EC tablet Take 81 mg by mouth daily.      [provider]  hydrochlorothiazide (HYDRODIURIL) 25 MG tablet Take 1 tablet (25 mg total) by mouth daily. 09/12/18   Martyn Malay, MD  losartan (COZAAR) 50 MG tablet Take 1 tablet (50 mg total) by mouth daily. 09/12/18   Martyn Malay, MD  metFORMIN (GLUCOPHAGE) 1000 MG tablet TAKE 1 TABLET BY MOUTH EVERY MORNING AND 1 TABLET IN THE EVENING 09/12/18   Martyn Malay, MD    Family History Family History  Problem Relation Age of Onset  . Diabetes Father   . Hypertension Father   . Hypertension Mother   . Thyroid disease Mother   . Hypertension Sister   . Arthritis Sister   . Thyroid disease Sister        Thyroid cancer  . Diabetes Brother   . Drug abuse Brother   . Diabetes Brother   . Diabetes Maternal Grandmother        breast  . Thyroid disease Sister   . Breast cancer Neg Hx     Social History Social History   Tobacco Use  . Smoking status: Never Smoker  . Smokeless tobacco: Never Used  Substance Use Topics  . Alcohol use: Yes    Alcohol/week: 5.0 standard drinks    Types: 3 Cans of beer, 2 Shots of liquor per week  . Drug use: No     Allergies   Patient has no known allergies.  Review of Systems Review of Systems  Musculoskeletal: Positive for myalgias.  Skin: Negative for color change and wound.     Physical Exam Updated Vital Signs BP (!) 160/59 (BP Location: Left Arm)   Pulse 77   Temp 98.3 F (36.8 C) (Oral)   Resp 14   Ht 5\' 5"  (1.651 m)   Wt 95.3 kg   SpO2 100%   BMI 34.95 kg/m   Physical Exam Vitals signs and nursing note reviewed.  Constitutional:      General: She is not in acute distress.    Appearance: She is well-developed.  HENT:     Head: Normocephalic and atraumatic.  Eyes:     Conjunctiva/sclera: Conjunctivae normal.  Cardiovascular:     Rate and Rhythm: Normal rate.  Pulmonary:     Effort: Pulmonary effort is normal.  Musculoskeletal:     Comments: Dorsum of right foot along the medial aspect with  tenderness, located over the tarsal bones and medial malleolus.  No obvious deformity or bruising.  Mild swelling.  Normal range of motion.  Neurological:     Mental Status: She is alert.  Psychiatric:        Mood and Affect: Mood normal.        Behavior: Behavior normal.      ED Treatments / Results  Labs (all labs ordered are listed, but only abnormal results are displayed) Labs Reviewed - No data to display  EKG None  Radiology Dg Foot Complete Right  Result Date: 12/15/2018 CLINICAL DATA:  Injury 2 weeks ago.  Pain in the medial foot. EXAM: RIGHT FOOT COMPLETE - 3+ VIEW COMPARISON:  None. FINDINGS: Negative for fracture or dislocation. Lateral deviation of second toe distal phalanx is probably chronic. Mild irregularity involving the fifth toe proximal phalanx near the base but no definite fracture. Prominent plantar calcaneal spur. Soft tissue swelling along the medial aspect of the hindfoot region. IMPRESSION: No acute bone abnormality to the right foot. Soft tissue swelling along the medial aspect of the foot. Electronically Signed   By: Markus Daft M.D.   On: 12/15/2018 11:47    Procedures Procedures (including critical care time)  Medications Ordered in ED Medications - No data to display   Initial Impression / Assessment and Plan / ED Course  I have reviewed the triage vital signs and the nursing notes.  Pertinent labs & imaging results that were available during my care of the patient were reviewed by me and considered in my medical decision making (see chart for details).     Patient with contusion to her right foot after dropping a 10 pound bucket on it 2 weeks ago.  She has had persistent pain, worse with walking and palpation.  X-ray is negative for acute fracture.  Discussed symptomatic management including ice, elevation, NSAIDs.  Ace wrap applied for compression and comfort.  PCP follow-up if symptoms persist.  Safe for discharge.  Agreeable to  plan.  Discussed results, findings, treatment and follow up. Patient advised of return precautions. Patient verbalized understanding and agreed with plan.  Final Clinical Impressions(s) / ED Diagnoses   Final diagnoses:  Contusion of right foot, initial encounter    ED Discharge Orders    None       Robinson, [place] N, PA-C 12/15/18 1227    Lacretia Leigh, MD 12/16/18 [phone]

## 2018-12-15 NOTE — Discharge Instructions (Signed)
Please read instructions below. Apply ice to your foot for 20 minutes at a time. Elevate it when possible. You can take ibuprofen every 6 hours as needed for pain. Schedule an appointment with your primary care doctor if symptoms do not improve.  Return to the ER for new or concerning symptoms.

## 2018-12-31 ENCOUNTER — Other Ambulatory Visit: Payer: Self-pay

## 2018-12-31 ENCOUNTER — Ambulatory Visit (INDEPENDENT_AMBULATORY_CARE_PROVIDER_SITE_OTHER): Payer: No Typology Code available for payment source | Admitting: Family Medicine

## 2018-12-31 ENCOUNTER — Encounter: Payer: Self-pay | Admitting: Family Medicine

## 2018-12-31 VITALS — BP 132/80 | HR 57 | Temp 97.6°F | Ht 65.0 in | Wt 215.8 lb

## 2018-12-31 DIAGNOSIS — E1165 Type 2 diabetes mellitus with hyperglycemia: Secondary | ICD-10-CM

## 2018-12-31 DIAGNOSIS — M79671 Pain in right foot: Secondary | ICD-10-CM

## 2018-12-31 LAB — POCT GLYCOSYLATED HEMOGLOBIN (HGB A1C): HBA1C, POC (CONTROLLED DIABETIC RANGE): 6.5 % (ref 0.0–7.0)

## 2018-12-31 MED ORDER — METFORMIN HCL 1000 MG PO TABS: ORAL_TABLET | ORAL | 1 refills | Status: DC

## 2018-12-31 MED ORDER — TRAMADOL HCL 50 MG PO TABS: 50.0000 mg | ORAL_TABLET | Freq: Three times a day (TID) | ORAL | 0 refills | Status: DC | PRN

## 2018-12-31 MED ORDER — LOSARTAN POTASSIUM 50 MG PO TABS: 50.0000 mg | ORAL_TABLET | Freq: Every day | ORAL | 3 refills | Status: DC

## 2018-12-31 MED ORDER — HYDROCHLOROTHIAZIDE 25 MG PO TABS: 25.0000 mg | ORAL_TABLET | Freq: Every day | ORAL | 1 refills | Status: DC

## 2018-12-31 MED FILL — traMADol HCL 50 MG TABS: 50 | 10 days supply | Qty: 30 | Fill #0

## 2018-12-31 MED FILL — HYDROCHLOROTHIAZIDE 25 MG T: 25 | 90 days supply | Qty: 90 | Fill #0

## 2018-12-31 NOTE — Progress Notes (Signed)
Subjective:    Jasmine Dickson is a 58 y.o. female who presents to [facility] today for diabetes:  1.  Diabetes:  Currently on metformin.    No adverse effects from medication.  No hypoglycemic events.  No paresthesia or peripheral nerve pain.  No polyuria/polydipsia.  Measures blood sugars at home every -- does NOT check CBGs at home.       Lab Results  Component Value Date   HGBA1C 6.5 12/31/2018   2. Right foot pain: Describes as alternating between 6 and 10 out of 10 on pain scale.  Ongoing issue for the past 2 months.  She was seen in the emergency department after she dropped a bucket on her foot.  After several weeks this had not improved.  She had negative radiographs of the emergency department.  Still with ongoing pain.  She was given an Ace bandage in the emergency department but feels it is not helping.  Has tried over-the-counter Tylenol, ibuprofen, Aleve without much relief.  Worse when she walks on it but does ache throughout the day.    ROS as above per HPI.   The following portions of the patient's history were reviewed and updated as appropriate: allergies, current medications, past medical history, family and social history, and problem list. Patient is a nonsmoker.    PMH reviewed.  Past Medical History:  Diagnosis Date  . Diabetes mellitus without complication ([place])   . HLD (hyperlipidemia)   . Hypertension   . Obesity, Class III, BMI 40-49.9 (morbid obesity) ([place]) 10/07/2002   BMI was 41.9   Past Surgical History:  Procedure Laterality Date  . VAGINAL HYSTERECTOMY  01/19/2000   large fibroid    Medications reviewed. Current Outpatient Medications  Medication Sig Dispense Refill  . aspirin 81 MG EC tablet Take 81 mg by mouth daily.     . hydrochlorothiazide (HYDRODIURIL) 25 MG tablet Take 1 tablet (25 mg total) by mouth daily. 90 tablet 1  . losartan (COZAAR) 50 MG tablet Take 1 tablet (50 mg total) by mouth daily. 90 tablet 3  . metFORMIN (GLUCOPHAGE) 1000 MG tablet  TAKE 1 TABLET BY MOUTH EVERY MORNING AND 1 TABLET IN THE EVENING 180 tablet 1   No current facility-administered medications for this visit.      Objective:   Physical Exam BP 132/80   Pulse (!) 57   Temp 97.6 F (36.4 C) (Oral)   Ht 5\' 5"  (1.651 m)   Wt 215 lb 12.8 oz (97.9 kg)   SpO2 98%   BMI 35.91 kg/m  Gen:  Alert, cooperative patient who appears stated age in no acute distress.  Vital signs reviewed. HEENT: EOMI,  MMM Feet:  Left foot WNL.  Right foot with tenderness across the plantar aspect of her foot.  Plantar and dorsiflexion is 5 out of 5 although it does cause some pain across the plantar aspect of her foot with plantar flexion.  She has some minimal tenderness in the soft tissue here.  Also some minimal tenderness and soft tissue for medial malleolus.  No actual joint laxity.  Anterior/posterior drawer tests are both negative.  No bruising.  No redness or other signs of infection.   No results found for this or any previous visit (from the past 72 hour(s)).

## 2018-12-31 NOTE — Patient Instructions (Signed)
It was good to see you again today.  We are going to check some blood work today.  I have refilled all of your medicines.  Take the tramadol 1 to 2 pills every 8 hours as needed for pain relief.  Pick up an ASO brace for your foot and ankle.  This will provide you some support.  I have referred you to sports medicine.  They will call you with the referral.  If you have not anything at this time next week call and ask [place].

## 2019-01-01 LAB — LIPID PANEL
Chol/HDL Ratio: 2.8 ratio (ref 0.0–4.4)
Cholesterol, Total: 179 mg/dL (ref 100–199)
HDL: 65 mg/dL (ref >39)
LDL Calculated: 103 mg/dL — ABNORMAL HIGH (ref 0–99)
Triglycerides: 54 mg/dL (ref 0–149)
VLDL Cholesterol Cal: 11 mg/dL (ref 5–40)

## 2019-01-01 LAB — COMPREHENSIVE METABOLIC PANEL
ALK PHOS: 72 IU/L (ref 39–117)
ALT: 10 IU/L (ref 0–32)
AST: 16 IU/L (ref 0–40)
Albumin/Globulin Ratio: 1.5 (ref 1.2–2.2)
Albumin: 4.2 g/dL (ref 3.5–5.5)
BUN / CREAT RATIO: 21 (ref 9–23)
BUN: 16 mg/dL (ref 6–24)
Bilirubin Total: 0.6 mg/dL (ref 0.0–1.2)
CO2: 22 mmol/L (ref 20–29)
Calcium: 9.6 mg/dL (ref 8.7–10.2)
Chloride: 101 mmol/L (ref 96–106)
Creatinine, Ser: 0.75 mg/dL (ref 0.57–1.00)
GFR calc Af Amer: 103 mL/min/{1.73_m2} (ref >59)
GFR calc non Af Amer: 89 mL/min/{1.73_m2} (ref >59)
GLOBULIN, TOTAL: 2.8 g/dL (ref 1.5–4.5)
Glucose: 91 mg/dL (ref 65–99)
Potassium: 3.9 mmol/L (ref 3.5–5.2)
Sodium: 138 mmol/L (ref 134–144)
Total Protein: 7 g/dL (ref 6.0–8.5)

## 2019-01-01 LAB — CBC
Hematocrit: 35.8 % (ref 34.0–46.6)
Hemoglobin: 12.1 g/dL (ref 11.1–15.9)
MCH: 30.6 pg (ref 26.6–33.0)
MCHC: 33.8 g/dL (ref 31.5–35.7)
MCV: 90 fL (ref 79–97)
Platelets: 308 10*3/uL (ref 150–450)
RBC: 3.96 x10E6/uL (ref 3.77–5.28)
RDW: 13.5 % (ref 11.7–15.4)
WBC: 6.2 10*3/uL (ref 3.4–10.8)

## 2019-01-02 ENCOUNTER — Encounter: Payer: Self-pay | Admitting: Family Medicine

## 2019-01-02 DIAGNOSIS — M79671 Pain in right foot: Secondary | ICD-10-CM

## 2019-01-02 HISTORY — DX: Pain in right foot: M79.671

## 2019-01-02 NOTE — Assessment & Plan Note (Signed)
Controlled today.  Continue metformin.  At goal.  Continue working on dietary improvements as she has been.

## 2019-01-02 NOTE — Assessment & Plan Note (Signed)
Subacute.   Secondary initially to trauma.  Now with some minimal soft tissue swelling and ongoing pain.  Sending to sports medicine for further input and possible ultrasound to better elucidate source of pain.   Tramadol for pain relief in meantime.

## 2019-01-03 ENCOUNTER — Encounter: Payer: Self-pay | Admitting: Family Medicine

## 2019-01-09 ENCOUNTER — Encounter: Payer: Self-pay | Admitting: Family Medicine

## 2019-01-13 MED FILL — metFORMIN HCL 1000 MG TABS: 1000 | 90 days supply | Qty: 180 | Fill #0

## 2019-01-15 ENCOUNTER — Encounter: Payer: Self-pay | Admitting: Sports Medicine

## 2019-01-15 ENCOUNTER — Ambulatory Visit (INDEPENDENT_AMBULATORY_CARE_PROVIDER_SITE_OTHER): Payer: No Typology Code available for payment source | Admitting: Sports Medicine

## 2019-01-15 DIAGNOSIS — M2141 Flat foot [pes planus] (acquired), right foot: Secondary | ICD-10-CM | POA: Insufficient documentation

## 2019-01-15 DIAGNOSIS — M2142 Flat foot [pes planus] (acquired), left foot: Secondary | ICD-10-CM

## 2019-01-15 DIAGNOSIS — M76821 Posterior tibial tendinitis, right leg: Secondary | ICD-10-CM

## 2019-01-15 HISTORY — DX: Flat foot (pes planus) (acquired), left foot: M21.42

## 2019-01-15 HISTORY — DX: Flat foot (pes planus) (acquired), right foot: M21.41

## 2019-01-15 HISTORY — DX: Posterior tibial tendinitis, right leg: M76.821

## 2019-01-15 MED ORDER — NAPROXEN 500 MG PO TABS: 500.0000 mg | ORAL_TABLET | Freq: Two times a day (BID) | ORAL | 1 refills | Status: DC | PRN

## 2019-01-15 MED FILL — NAPROXEN 500 MG TABLET: 500 | 30 days supply | Qty: 60 | Fill #0

## 2019-01-15 NOTE — Assessment & Plan Note (Signed)
-   likely contributing to PTTD - temporary orthotics vs custom orthotics and posterior tib tendon strengthening upon follow up

## 2019-01-15 NOTE — Assessment & Plan Note (Signed)
-   CAM walker boot to calm down inflammation for 1-2 weeks. May take off to shower and sleep. Wear with prolonged walking or activity. - naproxen 500mg  bid for 1 mo for pain - d/c tramadol - follow up in 2-3 weeks to consider temporary vs custom orthotics and PTT strengthening program - reviewed previous foot xrays which were negative - of note, did not have ankle xrays performed. Will need this +/- ultrasound if not improving.

## 2019-01-15 NOTE — Progress Notes (Signed)
Jasmine Dickson - 58 y.o. female MRN [id number]  Date of birth: 1961/09/18   Chief complaint: R foot and ankle pain  SUBJECTIVE:    History of present illness: 58 year old female who presents today with a chief complaint of right foot and ankle pain.  Last November, she had an injury where she dropped a 10 pound bucket on the right medial aspect of her foot and ankle.  She presented to the emergency department with pain and swelling along her medial malleolus.  Foot x-rays were taken which were negative for any fractures or dislocations.  She was told to take Tylenol or ibuprofen to help with her symptoms.  Her foot pain, swelling, and ecchymoses persisted so she followed up with her primary care physician.  He recommended tramadol for breakthrough pain as well as anti-inflammatories and activity modifications.  He also did refer to sports medicine for further treatment and management.  Today, she states her pain is 9 out of 10 and is worse with any type of active movements like walking.  She states she is limping every step that she takes.  Pain is dull and nonradiating primarily localized to the left medial ankle and over the course of the posterior tibialis tendon.  She works on her feet all the time and is finding work difficult with how much pain she is in.  She did try naproxen for treatment which seemed to help a lot.  She does states she is weak with the toe off position of her right foot.  No prior injuries of her foot and ankle.  She does have a history of pes planus however she has not had problems with this in the past.  She denies any knee pain or hip pain associated with her problem.  She denies any numbness or tingling of the foot.  She does have a history of plantar fasciitis on the left foot however this does not feel like that.  She does admit that she still has swelling however her bruising has improved.   Review of systems:  As stated above   Past medical history: Hypertension,  allergic rhinitis, type 2 diabetes non-insulin-dependent, obesity Past surgical history: Hysterectomy Past family history: Thyroid disease, diabetes, hypertension Social history: Non-smoker.  She works on her feet all day and does lots of recreational walking  Medications: Tramadol, aspirin, hydrochlorothiazide, losartan, metformin Allergies: No known drug allergies  OBJECTIVE:  Physical exam: Vital signs are reviewed. BP (!) 164/82   Ht 5\' 5"  (1.651 m)   Wt 215 lb (97.5 kg)   BMI 35.78 kg/m   Gen.: Alert, oriented, appears stated age, in no apparent distress Respiratory: Normal respirations, able to speak in full sentences Cardiac: Regular rate, distal pulses 2+ Integumentary: No rashes, positive swelling noted of her medial ankle Neurologic:  Sensation is intact light touch L4-S1 on the right Gait: Significant limp noted favoring her right side Musculoskeletal: Inspection of her right foot demonstrates obvious swelling just distal to her medial malleolus.  She has total collapse of her longitudinal arch medially on the right side.  She has significant tenderness to palpation over the course of the posterior tibialis tendon at its insertion and just posterior to the medial malleolus.  No bony tenderness over the medial malleolus.  No tibiotalar joint tenderness.  No lateral malleolus tenderness.  She has full range of motion ankle dorsiflexion plantarflexion.  Pain is reproduced with toe off with inability to toe off with total weightbearing.  Negative anterior drawer.  Negative ankle inversion eversion test.  She is neurovascularly intact.    ASSESSMENT & PLAN: Posterior tibial tendonitis of right leg - CAM walker boot to calm down inflammation for 1-2 weeks. May take off to shower and sleep. Wear with prolonged walking or activity. - naproxen 500mg  bid for 1 mo for pain - d/c tramadol - follow up in 2-3 weeks to consider temporary vs custom orthotics and PTT strengthening program -  reviewed previous foot xrays which were negative - of note, did not have ankle xrays performed. Will need this +/- ultrasound if not improving.  Bilateral pes planus - likely contributing to PTTD - temporary orthotics vs custom orthotics and posterior tib tendon strengthening upon follow up   Meds ordered this encounter  Medications  . naproxen (NAPROSYN) 500 MG tablet    Sig: Take 1 tablet (500 mg total) by mouth 2 (two) times daily as needed.    Dispense:  60 tablet    Refill:  1      Clydene Laming, DO Sports Medicine Fellow [place]  I was the preceptor for this visit and available for immediate consultation Shellia Cleverly, DO

## 2019-01-15 NOTE — Patient Instructions (Addendum)
It is great to see you today for your office visit.  We placed you in a cam walker boot and sent in naproxen for you to take 1 pill twice a day.  Counseled on common and severe side effects of anti-inflammatory use.  I will see you back in 2 weeks after you have rested your posterior tibialis tendon so that we may start the strengthening phase of your rehabilitation.  This will be a home exercise program as well as including temporary versus custom orthotics to support your arch.

## 2019-01-28 MED FILL — LOSARTAN POTASSIUM 50 MG TA: 50 | 90 days supply | Qty: 90 | Fill #1

## 2019-01-29 ENCOUNTER — Ambulatory Visit (INDEPENDENT_AMBULATORY_CARE_PROVIDER_SITE_OTHER): Payer: No Typology Code available for payment source | Admitting: Sports Medicine

## 2019-01-29 ENCOUNTER — Encounter: Payer: Self-pay | Admitting: Sports Medicine

## 2019-01-29 VITALS — BP 135/65 | Ht 65.0 in | Wt 210.0 lb

## 2019-01-29 DIAGNOSIS — M76821 Posterior tibial tendinitis, right leg: Secondary | ICD-10-CM | POA: Diagnosis not present

## 2019-01-29 NOTE — Patient Instructions (Signed)
It is great to see you today for your office visit.  We are now transitioning from a walking boot to temporary orthotics.  I gave you a catalog in which you can purchase more of these from the company if you would like more shoes to have these in them.  If you really like them, we can make you a pair of custom orthotics in the future as well.  Please do your posterior tibialis tendon exercises for strengthening and prevention of this recurrent problem.  Do the exercises daily for the next 6 weeks.  Follow-up will be tentatively as needed.  You may continue your anti-inflammatory medication for the next 2 to 4 weeks.  Feel free to call if you have any questions or schedule a follow-up appointment.

## 2019-01-29 NOTE — Progress Notes (Signed)
  Jasmine Dickson - 58 y.o. female MRN [id number]  Date of birth: [birth year]   Chief complaint: Right foot and ankle pain  SUBJECTIVE:    History of present illness: Patient is a 58 year old female that is here today for follow-up of posterior tibialis tendon dysfunction on the right.  She originally had an injury which damaged her medial longitudinal arch of her foot.  She was diagnosed with PTTD.  She was placed in a cam walker boot for the past 2 weeks.  She states she is approximately 75% improved since last visit.  She does state that she feels like the boot is causing stiffness and is rubbing on the lateral aspect of her midfoot.  She would like to get out of this today.  She has experimented at home barefoot or with sandals however she has mild pain with ambulation.  She still has weakness with toe off.  Denies any new symptoms of numbness or tingling.  Denies any knee pain or hip pain associated with her problem.  Denies any open sores or rashes.   Review of systems:  As stated above   Interval past medical history, surgical history, family history, and social history obtained and are unchanged.   Medications reviewed and unchanged.  Of note she still is taking naproxen 500 mg twice daily without any medication intolerance. Allergies reviewed and unchanged.  OBJECTIVE:  Physical exam: Vital signs are reviewed. BP 135/65   Ht 5\' 5"  (1.651 m)   Wt 210 lb (95.3 kg)   BMI 34.95 kg/m   Gen.: Alert, oriented, appears stated age, in no apparent distress Integumentary: No rashes, pressure sores, or lacerations Neurologic:  Sensation is intact to light touch L4-S1 on the right Gait: Antalgic favoring her right side Musculoskeletal: Inspection of her bilateral feet demonstrate total collapse of her arches bilaterally.   Mild tenderness palpation over her posterior tibialis tendon.  Full range of motion ankle dorsiflexion plantarflexion. She has significant posterior tibialis tendon weakness  4.5 out of 5 on the right.  Negative anterior drawer.  Negative ankle inversion eversion test.  Pain with toe off on the right.  Neurovascularly intact.    ASSESSMENT & PLAN: Posterior tibial tendonitis of right leg -Improving significantly with immobilization in a cam walker boot -Transition to temporary orthotics size 9-10 with a small scaphoid pad for medial arch support -Perform posterior tibialis tendon strengthening exercises daily.  Athletic trainer did teach these exercises in the office today. -Follow-up as needed.  We may do custom orthotics if she desires for her feet. -Naproxen 500 mg twice daily as needed at this point    Clydene Laming, [place]  I was the preceptor for this visit and available for immediate consultation Shellia Cleverly, DO

## 2019-01-29 NOTE — Assessment & Plan Note (Signed)
-  Improving significantly with immobilization in a cam walker boot -Transition to temporary orthotics size 9-10 with a small scaphoid pad for medial arch support -Perform posterior tibialis tendon strengthening exercises daily.  Athletic trainer did teach these exercises in the office today. -Follow-up as needed.  We may do custom orthotics if she desires for her feet. -Naproxen 500 mg twice daily as needed at this point

## 2019-02-21 IMAGING — CR DG KNEE 1-2V*L*
2 series · 2 of 2 positions shown · non-contrast
Comparison: None.

CLINICAL DATA: Chronic bilateral knee pain, left worse than right.

EXAM:
LEFT KNEE - 1-2 VIEW; RIGHT KNEE - 1-2 VIEW

[w knee ap left]
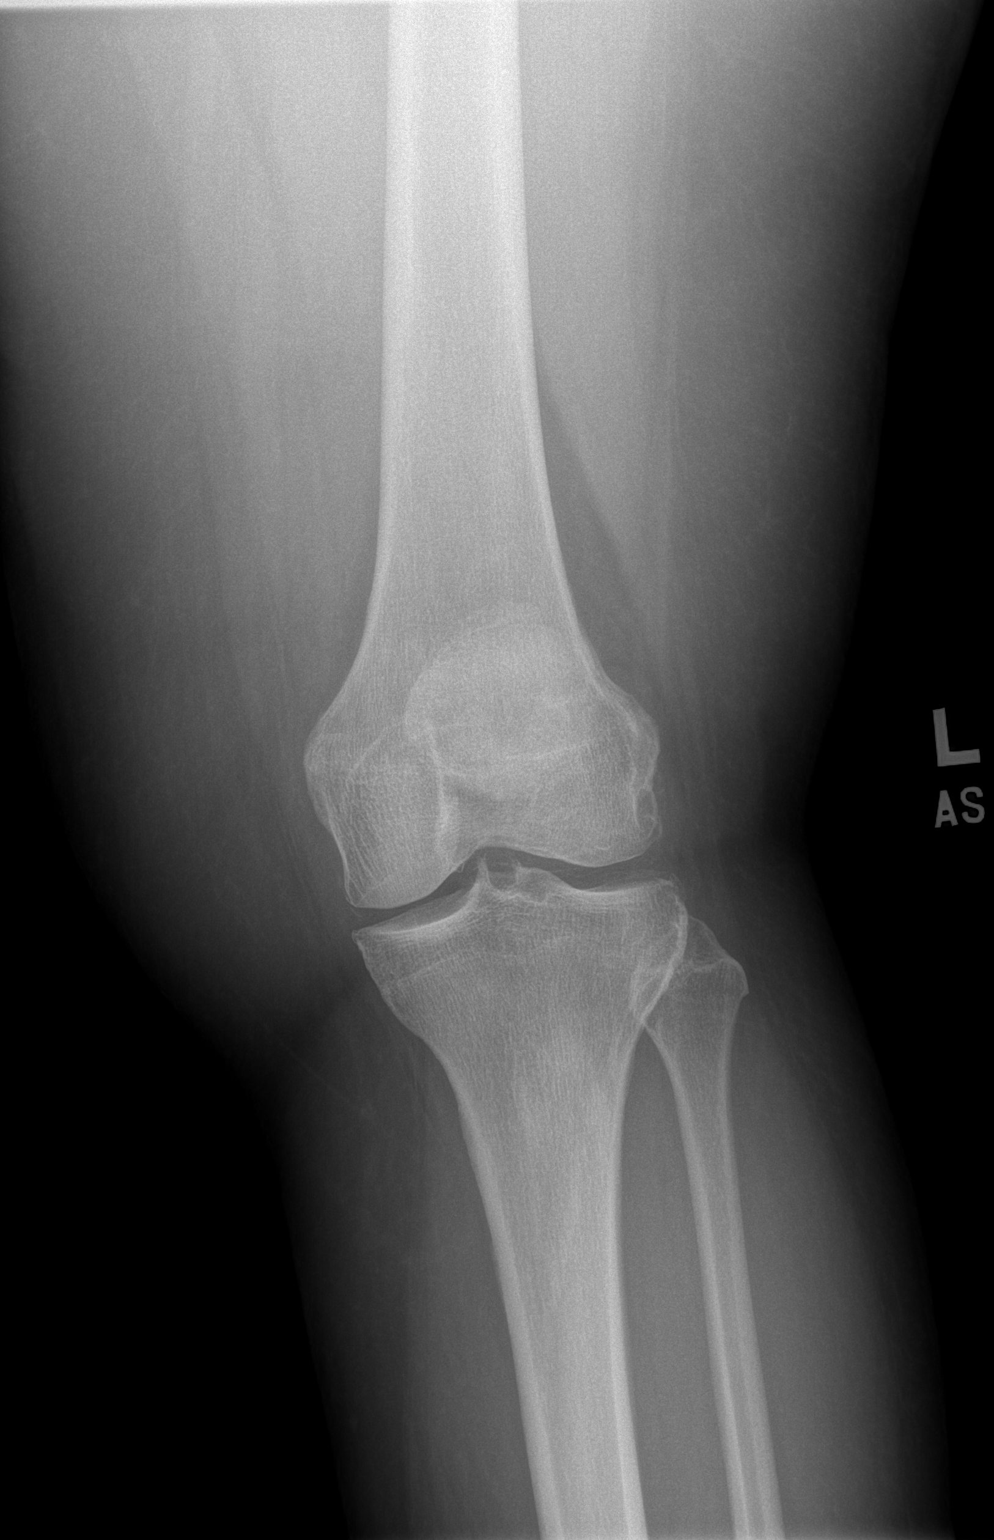

[w knee lat. left *]
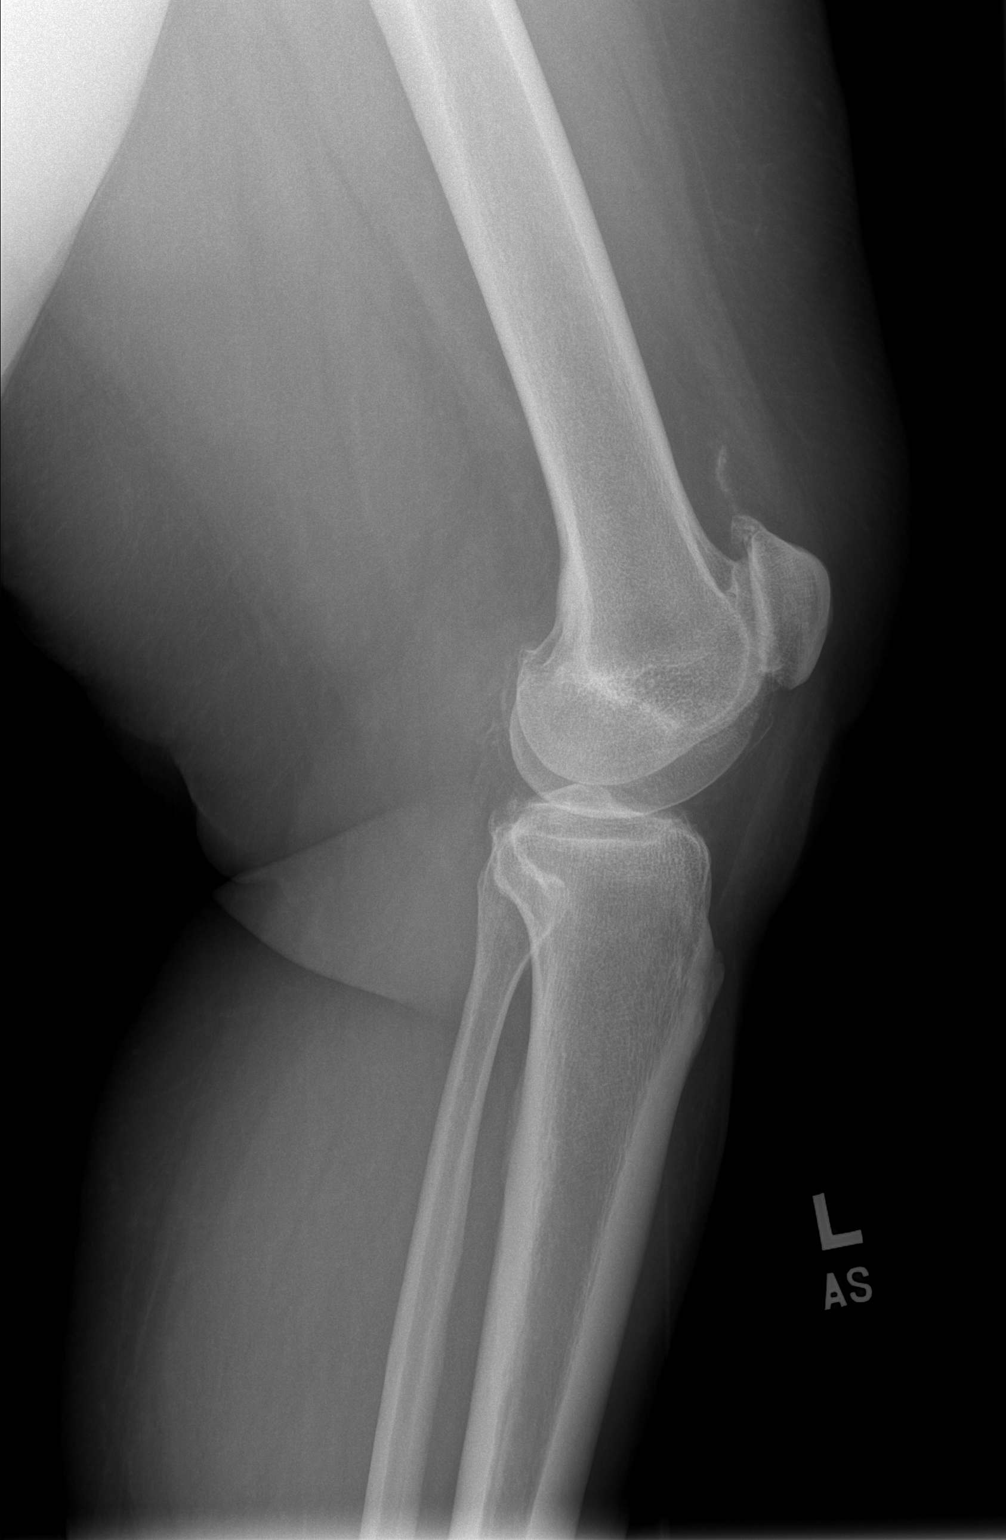

[2 of 2 positions shown; findings below may reference images not displayed]

FINDINGS: No acute bony or joint abnormality is seen on the right or left.
Medial and lateral compartment joint spaces are preserved with
minimal osteophytosis noted. More advanced patellofemoral
degenerative change is present bilaterally with subchondral
sclerosis of the patella and prominent osteophytes. There is some
chondrocalcinosis about both knees.
IMPRESSION: No acute abnormality.

Moderately severe appearing bilateral patellofemoral osteoarthritis
with minimal degenerative change in the medial and lateral
compartments.

Bilateral chondrocalcinosis.

## 2019-04-21 ENCOUNTER — Encounter: Payer: Self-pay | Admitting: Family Medicine

## 2019-04-21 ENCOUNTER — Other Ambulatory Visit: Payer: Self-pay | Admitting: Family Medicine

## 2019-04-21 MED ORDER — LOSARTAN POTASSIUM 50 MG PO TABS: 50.0000 mg | ORAL_TABLET | Freq: Every day | ORAL | 3 refills | Status: DC

## 2019-04-21 MED ORDER — METFORMIN HCL 1000 MG PO TABS: ORAL_TABLET | ORAL | 1 refills | Status: DC

## 2019-04-21 MED ORDER — HYDROCHLOROTHIAZIDE 25 MG PO TABS: 25.0000 mg | ORAL_TABLET | Freq: Every day | ORAL | 1 refills | Status: DC

## 2019-04-21 MED FILL — LOSARTAN POTASSIUM 50 MG TA: 50 | 30 days supply | Qty: 30 | Fill #0

## 2019-04-21 MED FILL — HYDROCHLOROTHIAZIDE 25 MG T: 25 | 90 days supply | Qty: 90 | Fill #0

## 2019-04-21 MED FILL — metFORMIN HCL 1000 MG TABS: 1000 | 90 days supply | Qty: 180 | Fill #0

## 2019-05-30 ENCOUNTER — Other Ambulatory Visit: Payer: Self-pay

## 2019-05-30 MED ORDER — LOSARTAN POTASSIUM 50 MG PO TABS: 50.0000 mg | ORAL_TABLET | Freq: Every day | ORAL | 1 refills | Status: DC

## 2019-05-30 MED FILL — LOSARTAN POTASSIUM 50 MG TA: 50 | 90 days supply | Qty: 90 | Fill #0

## 2019-05-30 NOTE — Telephone Encounter (Signed)
Patient calls nurse line stating she is out of her BP medication. Patient stated it was sent to [place] [facility] back in May, however the pharmacy was closed due to [place]. Patient stated she is now down to her last pill. Patient has not yet been reassigned. Will forward to preceptor to look into filling.   [facility]

## 2019-07-23 ENCOUNTER — Encounter: Payer: Self-pay | Admitting: Family Medicine

## 2019-07-23 ENCOUNTER — Other Ambulatory Visit: Payer: Self-pay | Admitting: Family Medicine

## 2019-07-23 DIAGNOSIS — E119 Type 2 diabetes mellitus without complications: Secondary | ICD-10-CM

## 2019-07-23 DIAGNOSIS — Z5987 Material hardship: Secondary | ICD-10-CM

## 2019-07-23 DIAGNOSIS — Z598 Other problems related to housing and economic circumstances: Secondary | ICD-10-CM

## 2019-07-23 MED ORDER — METFORMIN HCL 1000 MG PO TABS: ORAL_TABLET | ORAL | 1 refills | Status: DC

## 2019-07-23 MED FILL — metFORMIN HCL 1000 MG TABS: 1000 | 90 days supply | Qty: 180 | Fill #0

## 2019-08-04 IMAGING — MG DIGITAL SCREENING BILATERAL MAMMOGRAM WITH CAD
4 series · 4 of 4 positions shown · non-contrast
Comparison: Previous exam(s).

CLINICAL DATA: Screening.

EXAM:
DIGITAL SCREENING BILATERAL MAMMOGRAM WITH CAD

[R MLO]
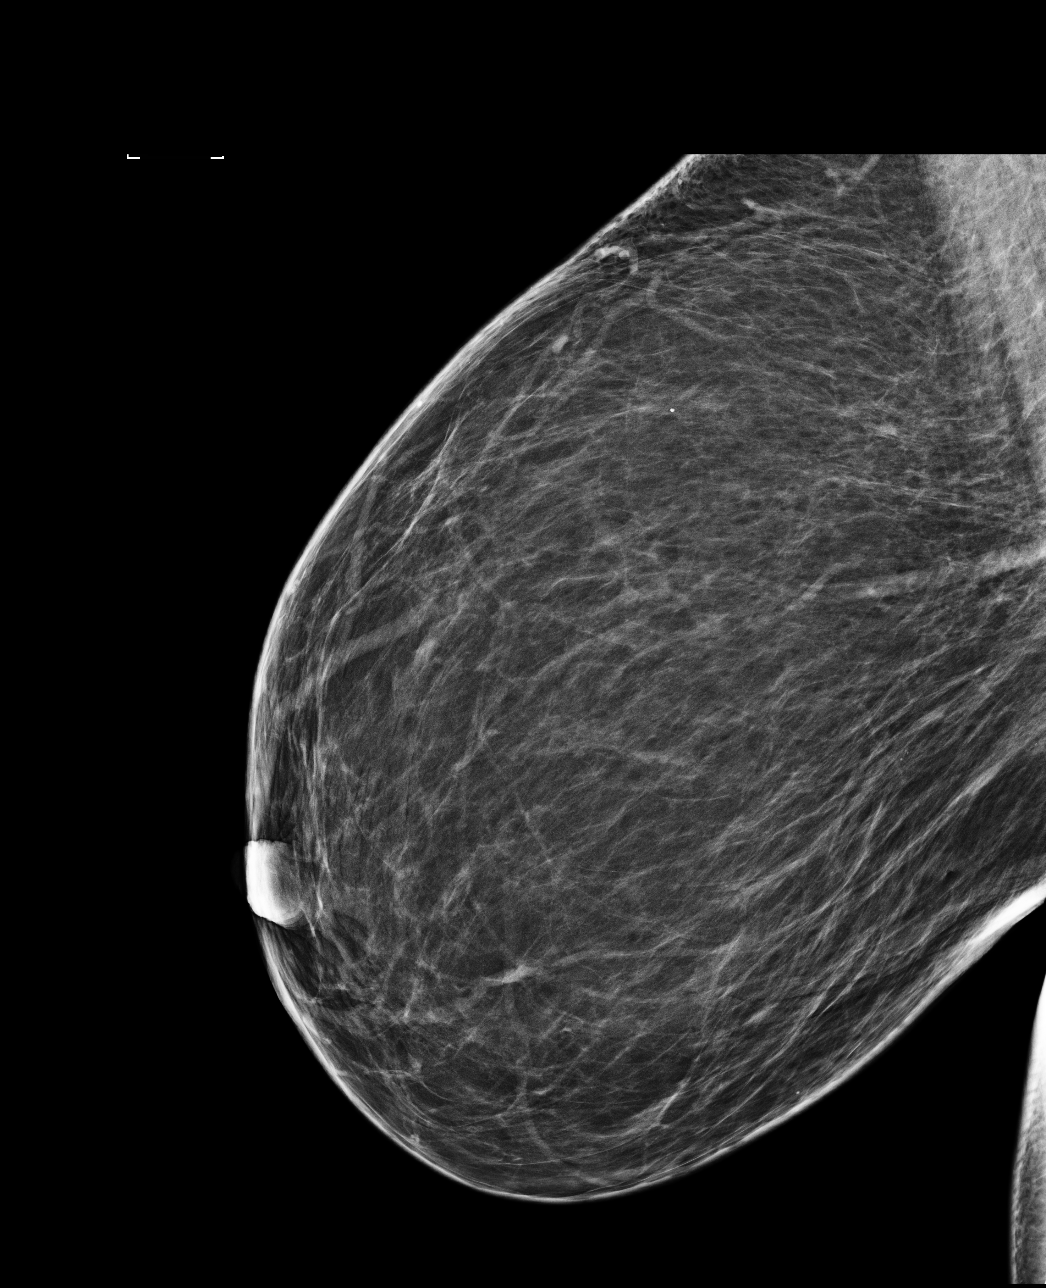

[R CC]
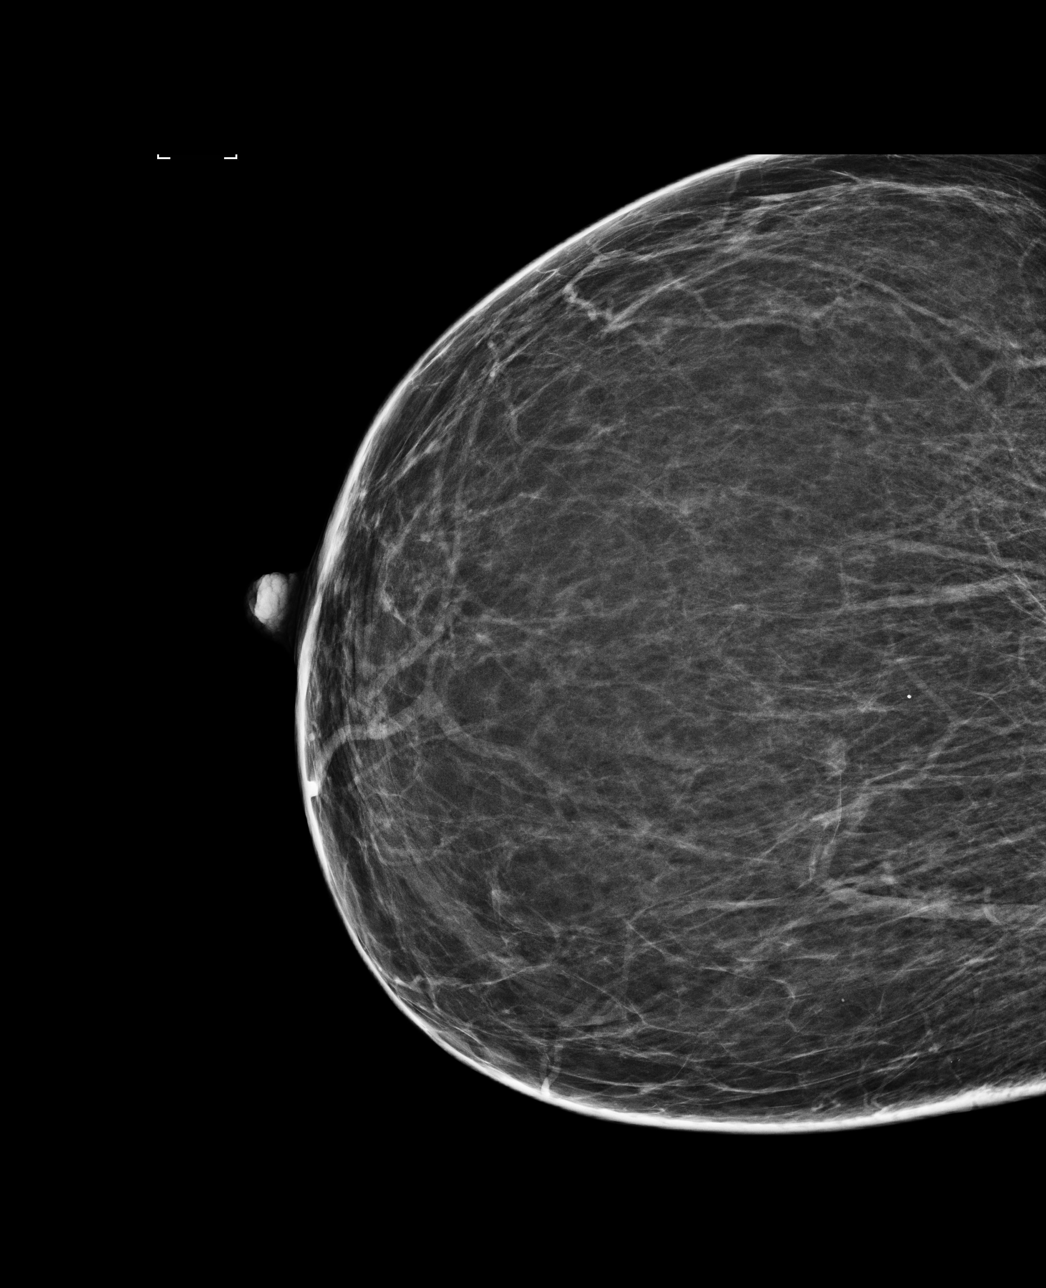

[L CC]
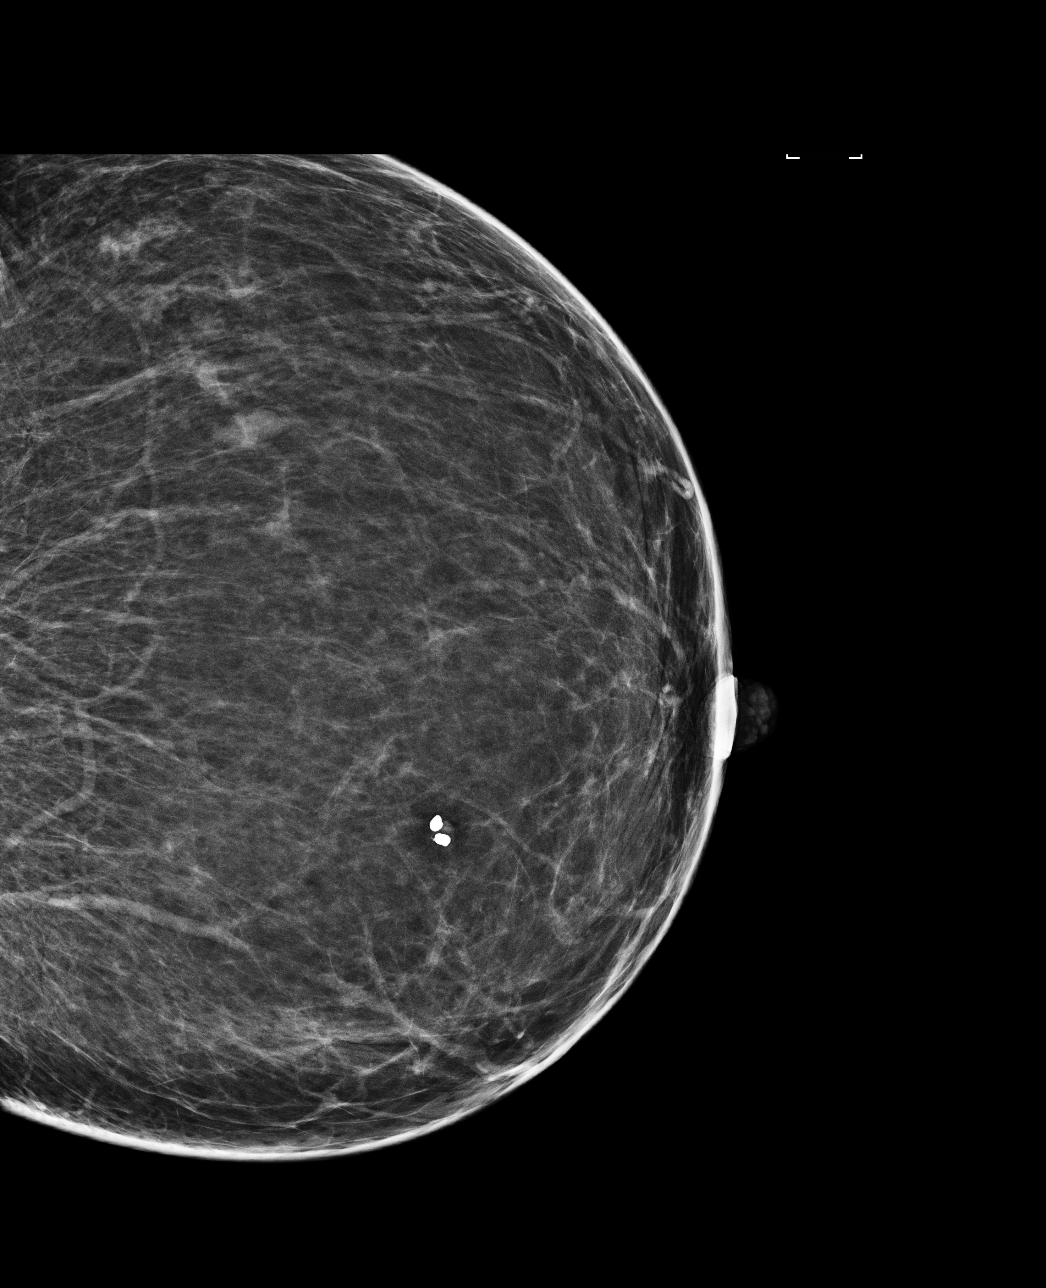

[L MLO]
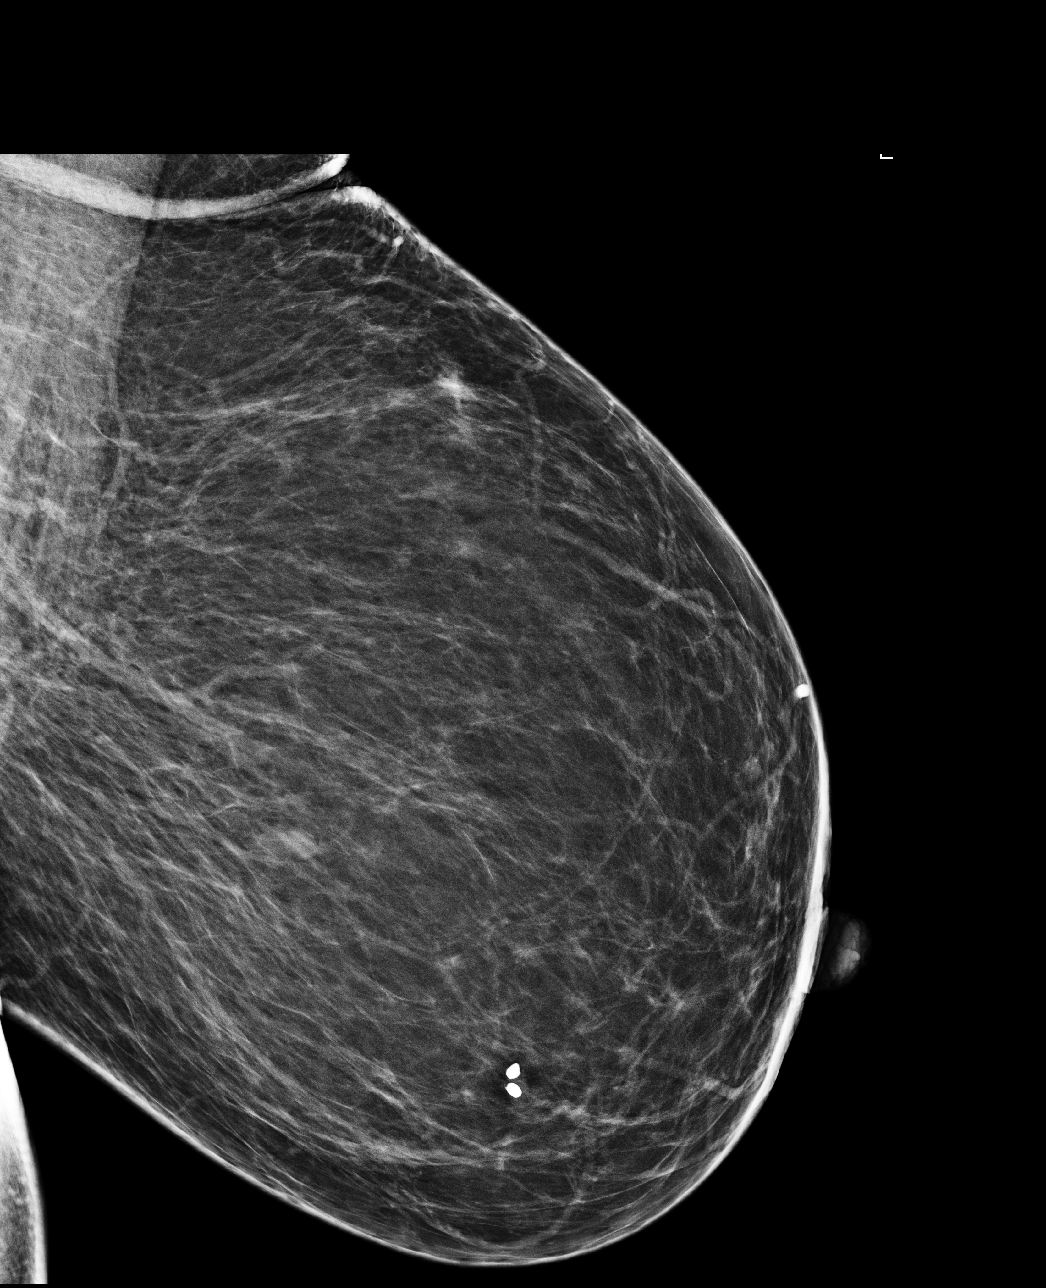

[4 of 4 positions shown; findings below may reference images not displayed]

ACR Breast Density Category b: There are scattered areas of
fibroglandular density.
FINDINGS: There are no findings suspicious for malignancy. Images were
processed with CAD.
IMPRESSION: No mammographic evidence of malignancy. A result letter of this
screening mammogram will be mailed directly to the patient.

RECOMMENDATION:
Screening mammogram in one year. (Code:AS-G-LCT)

BI-RADS CATEGORY  1: Negative.

## 2019-08-28 ENCOUNTER — Encounter: Payer: Self-pay | Admitting: Family Medicine

## 2019-08-28 ENCOUNTER — Ambulatory Visit (INDEPENDENT_AMBULATORY_CARE_PROVIDER_SITE_OTHER): Payer: No Typology Code available for payment source | Admitting: Family Medicine

## 2019-08-28 ENCOUNTER — Other Ambulatory Visit: Payer: Self-pay

## 2019-08-28 VITALS — BP 130/70 | HR 77

## 2019-08-28 DIAGNOSIS — I10 Essential (primary) hypertension: Secondary | ICD-10-CM

## 2019-08-28 DIAGNOSIS — E0520 Thyrotoxicosis with toxic multinodular goiter without thyrotoxic crisis or storm: Secondary | ICD-10-CM

## 2019-08-28 DIAGNOSIS — E1159 Type 2 diabetes mellitus with other circulatory complications: Secondary | ICD-10-CM | POA: Diagnosis not present

## 2019-08-28 DIAGNOSIS — Z1239 Encounter for other screening for malignant neoplasm of breast: Secondary | ICD-10-CM

## 2019-08-28 DIAGNOSIS — E785 Hyperlipidemia, unspecified: Secondary | ICD-10-CM

## 2019-08-28 DIAGNOSIS — E119 Type 2 diabetes mellitus without complications: Secondary | ICD-10-CM

## 2019-08-28 DIAGNOSIS — Z0000 Encounter for general adult medical examination without abnormal findings: Secondary | ICD-10-CM

## 2019-08-28 HISTORY — DX: Thyrotoxicosis with toxic multinodular goiter without thyrotoxic crisis or storm: E05.20

## 2019-08-28 LAB — POCT GLYCOSYLATED HEMOGLOBIN (HGB A1C): HbA1c, POC (controlled diabetic range): 6.1 % (ref 0.0–7.0)

## 2019-08-28 MED ORDER — LOSARTAN POTASSIUM 50 MG PO TABS: 50.0000 mg | ORAL_TABLET | Freq: Every day | ORAL | 1 refills | Status: DC

## 2019-08-28 MED ORDER — HYDROCHLOROTHIAZIDE 25 MG PO TABS: 25.0000 mg | ORAL_TABLET | Freq: Every day | ORAL | 3 refills | Status: DC

## 2019-08-28 MED FILL — HYDROCHLOROTHIAZIDE 25 MG T: 25 | 90 days supply | Qty: 90 | Fill #0

## 2019-08-28 MED FILL — LOSARTAN POTASSIUM 50 MG TA: 50 | 90 days supply | Qty: 90 | Fill #0

## 2019-08-28 NOTE — Progress Notes (Signed)
oict

## 2019-08-28 NOTE — Patient Instructions (Signed)
It was very nice to meet you Jasmine Dickson.  Your A1c 6.1% is great.  Keep up your healthy lifestyle.  Your blood pressure is under good control.  Your blood pressure medicine have been refilled for a year.    We can talk about taking a cholesterol lowering medicine once a week at the next office visit.   We will make a referral to [facility] for your mammogram to screen for breast cancer.   We can talk about screening for colon cancer in the future.

## 2019-09-01 ENCOUNTER — Encounter: Payer: Self-pay | Admitting: Family Medicine

## 2019-09-01 DIAGNOSIS — Z0000 Encounter for general adult medical examination without abnormal findings: Secondary | ICD-10-CM | POA: Insufficient documentation

## 2019-09-01 DIAGNOSIS — E119 Type 2 diabetes mellitus without complications: Secondary | ICD-10-CM

## 2019-09-01 DIAGNOSIS — E1159 Type 2 diabetes mellitus with other circulatory complications: Secondary | ICD-10-CM

## 2019-09-01 DIAGNOSIS — I152 Hypertension secondary to endocrine disorders: Secondary | ICD-10-CM

## 2019-09-01 NOTE — Assessment & Plan Note (Signed)
Referred for mammogram Discussed colonoscopy - patient wants to wait and readdress in future.  Referral to ophthamology for diabetic eye exam - patient plans to arrange in very soon

## 2019-09-01 NOTE — Assessment & Plan Note (Signed)
Established problem ASCVD 11% - intoleratnt sivastatin 20 mg daily in past bc of myalgias  Patient would consider taking a statin once a week then advance as tolerated. Will approach next ov 3 months

## 2019-09-01 NOTE — Assessment & Plan Note (Signed)
Needs TSH next visit

## 2019-09-01 NOTE — Assessment & Plan Note (Signed)
Adequate blood pressure control.  No evidence of new end organ damage.  Tolerating medication without significant adverse effects.  Plan to continue current blood pressure regiment.   

## 2019-09-01 NOTE — Assessment & Plan Note (Signed)
Adequate glycemic control.. Continue current lifestyle management

## 2019-09-01 NOTE — Progress Notes (Signed)
Subjective:    Patient ID: ELLICE Dickson, female    DOB: March 27, 1961, 58 y.o.   MRN: [id number] Jasmine Dickson is alone Sources of clinical information for visit is/are patient and past medical records. Nursing assessment for this office visit was reviewed with the patient for accuracy and revision.   Previous Report(s) Reviewed: historical medical records  Depression screen [facility] 2/9 08/28/2019  Decreased Interest 0  Down, Depressed, Hopeless 0  PHQ - 2 Score 0   No flowsheet data found. Adult vaccines due  Topic Date Due  . TETANUS/TDAP  05/31/2028  . PNEUMOCOCCAL POLYSACCHARIDE VACCINE AGE 31-64 HIGH RISK  Completed   Health Maintenance Due  Topic Date Due  . COLONOSCOPY  07/24/2012  . OPHTHALMOLOGY EXAM  10/05/2013  . FOOT EXAM  06/01/2019  . MAMMOGRAM  06/25/2019    History/P.E. limitations: none  Adult vaccines due  Topic Date Due  . TETANUS/TDAP  05/31/2028  . PNEUMOCOCCAL POLYSACCHARIDE VACCINE AGE 31-64 HIGH RISK  Completed    Diabetes Health Maintenance Due  Topic Date Due  . OPHTHALMOLOGY EXAM  10/05/2013  . FOOT EXAM  06/01/2019  . HEMOGLOBIN A1C  02/25/2020    Health Maintenance Due  Topic Date Due  . COLONOSCOPY  07/24/2012  . OPHTHALMOLOGY EXAM  10/05/2013  . FOOT EXAM  06/01/2019  . MAMMOGRAM  06/25/2019     Chief Complaint  Patient presents with  . Diabetes     HPI  HYPERTENSION Disease Monitoring: Blood pressure range-not checking  Chest pain no palpitations- no       Dyspnea- no Medications: Compliance- yes, losartan and HCTZ  Lightheadedness no Syncope- no    Edema- no  DIABETES Disease Monitoring: Blood Sugar ranges-notchecking  Polyuria/phagia/dipsia- no       Visual problems- no Medications: Compliance- yes, metformin 1000 mg BID  Hypoglycemic symptoms- no  HYPERLIPIDEMIA Disease Monitoring: See symptoms for Hypertension Medications: Compliance- not taking statin bc hx of myalgia on simvastatin 20 mg     Monitoring  Labs and Parameters Last A1C:  Lab Results  Component Value Date   HGBA1C 6.1 08/28/2019    Last Lipid:     Component Value Date/Time   CHOL 179 12/31/2018 1200   HDL 65 12/31/2018 1200   LDLDIRECT 112 (H) 12/31/2012 0837   HYPERLIPIDEMIA Symptoms Chest pain on exertion:  no   Leg claudication:   no Medications (modifying factor): No meds    Component Value Date/Time   CHOL 179 12/31/2018 1200   TRIG 54 12/31/2018 1200   HDL 65 12/31/2018 1200   LDLDIRECT 112 (H) 12/31/2012 0837   VLDL 11 06/08/2015 1114   CHOLHDL 2.8 12/31/2018 1200   CHOLHDL 3.5 06/08/2015 1114     Last Bmet  Potassium  Date Value Ref Range Status  12/31/2018 3.9 3.5 - 5.2 mmol/L Final   Sodium  Date Value Ref Range Status  12/31/2018 138 134 - 144 mmol/L Final   Creat  Date Value Ref Range Status  12/31/2015 0.67 0.50 - 1.05 mg/dL Final   Creatinine, Ser  Date Value Ref Range Status  12/31/2018 0.75 0.57 - 1.00 mg/dL Final     CrCl cannot be calculated (Patient's most recent lab result is older than the maximum 21 days allowed.).  Last BPs:  BP Readings from Last 3 Encounters:  08/28/19 130/70  01/29/19 135/65  01/15/19 (!) 164/82    SH: Smoking status reviewed  ROS: See HPI   Review of Systems  Objective:   Physical Exam PHYSICAL EXAM  There were no vitals filed for this visit.  VS reviewed GEN: Alert, Cooperative, Groomed, NAD COR: RRR, No M/G/R LUNGS: BCTA, No Acc mm use, speaking in full sentences EXT: No peripheral leg edema. Neuro: moving limbs spontaneously;DGait: Normal speed, No significant path deviation, Step through +  Psych: Normal affect/thought/speech/language Diabetic foot exam was performed with the following findings:   No deformities, ulcerations, or other skin breakdown Normal sensation of 10g monofilament Intact posterior tibialis and dorsalis pedis pulses Thicken nails         Assessment & Plan:

## 2019-09-03 MED ORDER — METFORMIN HCL 1000 MG PO TABS: ORAL_TABLET | ORAL | 3 refills | Status: DC

## 2019-09-03 MED ORDER — HYDROCHLOROTHIAZIDE 25 MG PO TABS: 25.0000 mg | ORAL_TABLET | Freq: Every day | ORAL | 3 refills | Status: DC

## 2019-09-03 MED ORDER — LOSARTAN POTASSIUM 50 MG PO TABS: 50.0000 mg | ORAL_TABLET | Freq: Every day | ORAL | 3 refills | Status: DC

## 2019-10-30 MED FILL — metFORMIN HCL 1000 MG TABS: 1000 | 90 days supply | Qty: 180 | Fill #1

## 2019-12-08 MED FILL — HYDROCHLOROTHIAZIDE 25 MG T: 25 | 90 days supply | Qty: 90 | Fill #1

## 2019-12-08 MED FILL — LOSARTAN POTASSIUM 50 MG TA: 50 | 90 days supply | Qty: 90 | Fill #1

## 2020-02-12 ENCOUNTER — Other Ambulatory Visit: Payer: Self-pay | Admitting: Family Medicine

## 2020-02-12 DIAGNOSIS — E119 Type 2 diabetes mellitus without complications: Secondary | ICD-10-CM

## 2020-02-13 ENCOUNTER — Other Ambulatory Visit: Payer: Self-pay | Admitting: Family Medicine

## 2020-02-15 ENCOUNTER — Encounter: Payer: Self-pay | Admitting: Family Medicine

## 2020-02-15 DIAGNOSIS — E119 Type 2 diabetes mellitus without complications: Secondary | ICD-10-CM

## 2020-02-16 MED ORDER — METFORMIN HCL 1000 MG PO TABS: ORAL_TABLET | ORAL | 1 refills | Status: DC

## 2020-02-16 MED FILL — metFORMIN HCL 1000 MG TABS: 1000 | 30 days supply | Qty: 60 | Fill #0

## 2020-02-28 ENCOUNTER — Ambulatory Visit: Payer: No Typology Code available for payment source | Attending: Internal Medicine

## 2020-02-28 DIAGNOSIS — Z23 Encounter for immunization: Secondary | ICD-10-CM

## 2020-02-28 NOTE — Progress Notes (Signed)
   Covid-19 Vaccination Clinic  Name:  Jasmine Dickson    MRN: [id number] DOB: 02-01-1961  02/28/2020  [name] was observed post Covid-19 immunization for 15 minutes without incident. She was provided with Vaccine Information Sheet and instruction to access the V-Safe system.   [name] was instructed to call 911 with any severe reactions post vaccine: Marland Kitchen Difficulty breathing  . Swelling of face and throat  . A fast heartbeat  . A bad rash all over body  . Dizziness and weakness   Immunizations Administered    Name Date Dose VIS Date Route   Pfizer COVID-19 Vaccine 02/28/2020  8:23 AM 0.3 mL 11/28/2019 Intramuscular   Manufacturer: [place]   Lot: [id number]   [place]: [id number]

## 2020-03-07 ENCOUNTER — Encounter: Payer: Self-pay | Admitting: Family Medicine

## 2020-03-07 DIAGNOSIS — E1159 Type 2 diabetes mellitus with other circulatory complications: Secondary | ICD-10-CM

## 2020-03-08 ENCOUNTER — Other Ambulatory Visit: Payer: Self-pay | Admitting: Family Medicine

## 2020-03-08 MED ORDER — LOSARTAN POTASSIUM 50 MG PO TABS: 50.0000 mg | ORAL_TABLET | Freq: Every day | ORAL | 3 refills | Status: DC

## 2020-03-08 MED ORDER — HYDROCHLOROTHIAZIDE 25 MG PO TABS: 25.0000 mg | ORAL_TABLET | Freq: Every day | ORAL | 3 refills | Status: DC

## 2020-03-08 MED FILL — HYDROCHLOROTHIAZIDE 25 MG T: 25 | 90 days supply | Qty: 90 | Fill #0

## 2020-03-08 MED FILL — LOSARTAN POTASSIUM 50 MG TA: 50 | 90 days supply | Qty: 90 | Fill #0

## 2020-03-22 MED FILL — metFORMIN HCL 1000 MG TABS: 1000 | 30 days supply | Qty: 60 | Fill #1

## 2020-03-23 ENCOUNTER — Ambulatory Visit: Payer: No Typology Code available for payment source | Attending: Internal Medicine

## 2020-03-23 DIAGNOSIS — Z23 Encounter for immunization: Secondary | ICD-10-CM

## 2020-03-23 NOTE — Progress Notes (Signed)
   Covid-19 Vaccination Clinic  Name:  Jasmine Dickson    MRN: [id number] DOB: [birth year]  03/23/2020  Jasmine Dickson was observed post Covid-19 immunization for 15 minutes without incident. She was provided with Vaccine Information Sheet and instruction to access the V-Safe system.   Jasmine Dickson was instructed to call 911 with any severe reactions post vaccine: Marland Kitchen Difficulty breathing  . Swelling of face and throat  . A fast heartbeat  . A bad rash all over body  . Dizziness and weakness   Immunizations Administered    Name Date Dose VIS Date Route   Pfizer COVID-19 Vaccine 03/23/2020  3:46 PM 0.3 mL 11/28/2019 Intramuscular   Manufacturer: [place]   Lot: [id number]   [place]: [id number]

## 2020-05-04 MED FILL — metFORMIN HCL 1000 MG TABS: 1000 | 30 days supply | Qty: 60 | Fill #2

## 2020-05-13 ENCOUNTER — Encounter: Payer: Self-pay | Admitting: Family Medicine

## 2020-05-13 ENCOUNTER — Other Ambulatory Visit: Payer: Self-pay

## 2020-05-13 ENCOUNTER — Ambulatory Visit (INDEPENDENT_AMBULATORY_CARE_PROVIDER_SITE_OTHER): Payer: No Typology Code available for payment source | Admitting: Family Medicine

## 2020-05-13 VITALS — BP 132/70 | HR 89 | Ht 65.0 in | Wt 216.0 lb

## 2020-05-13 DIAGNOSIS — E1159 Type 2 diabetes mellitus with other circulatory complications: Secondary | ICD-10-CM | POA: Diagnosis not present

## 2020-05-13 DIAGNOSIS — Z79899 Other long term (current) drug therapy: Secondary | ICD-10-CM | POA: Diagnosis not present

## 2020-05-13 DIAGNOSIS — E119 Type 2 diabetes mellitus without complications: Secondary | ICD-10-CM

## 2020-05-13 DIAGNOSIS — E0520 Thyrotoxicosis with toxic multinodular goiter without thyrotoxic crisis or storm: Secondary | ICD-10-CM

## 2020-05-13 DIAGNOSIS — E785 Hyperlipidemia, unspecified: Secondary | ICD-10-CM

## 2020-05-13 DIAGNOSIS — Z1211 Encounter for screening for malignant neoplasm of colon: Secondary | ICD-10-CM

## 2020-05-13 DIAGNOSIS — I10 Essential (primary) hypertension: Secondary | ICD-10-CM

## 2020-05-13 LAB — POCT GLYCOSYLATED HEMOGLOBIN (HGB A1C): HbA1c, POC (controlled diabetic range): 6.6 % (ref 0.0–7.0)

## 2020-05-13 MED ORDER — ATORVASTATIN CALCIUM 20 MG PO TABS: 20.0000 mg | ORAL_TABLET | Freq: Every day | ORAL | 0 refills | Status: DC

## 2020-05-13 MED FILL — ATORVASTATIN 20 MG TABLET: 20 | 90 days supply | Qty: 90 | Fill #0

## 2020-05-13 NOTE — Patient Instructions (Signed)
Your A1c is great! Keeping on with your healthy lifestyle and taking the metformin.    We are checking your kidneys, electrolytes, Cholesterol and Thyroid today.  If you have labs (blood work) drawn today and your tests are completely normal, you will receive your results only by:  [place] (if you have MyChart) OR  A paper copy in the mail If you have any lab test that is abnormal or we need to change your treatment, we will call you to review the results.  Start taking the atorvastatin 20 mg daily. If you start having aching like before, please let Dr Charlise Giovanetti know because there are some other medications that may work without aching.

## 2020-05-14 ENCOUNTER — Encounter: Payer: Self-pay | Admitting: Family Medicine

## 2020-05-14 ENCOUNTER — Encounter: Payer: Self-pay | Admitting: Gastroenterology

## 2020-05-14 LAB — BASIC METABOLIC PANEL
BUN/Creatinine Ratio: 13 (ref 9–23)
BUN: 13 mg/dL (ref 6–24)
CO2: 25 mmol/L (ref 20–29)
Calcium: 10.2 mg/dL (ref 8.7–10.2)
Chloride: 98 mmol/L (ref 96–106)
Creatinine, Ser: 1.01 mg/dL — ABNORMAL HIGH (ref 0.57–1.00)
GFR calc Af Amer: 71 mL/min/{1.73_m2} (ref >59)
GFR calc non Af Amer: 62 mL/min/{1.73_m2} (ref >59)
Glucose: 133 mg/dL — ABNORMAL HIGH (ref 65–99)
Potassium: 3.8 mmol/L (ref 3.5–5.2)
Sodium: 139 mmol/L (ref 134–144)

## 2020-05-14 LAB — LIPID PANEL
Chol/HDL Ratio: 2.3 ratio (ref 0.0–4.4)
Cholesterol, Total: 176 mg/dL (ref 100–199)
HDL: 76 mg/dL (ref >39)
LDL Chol Calc (NIH): 90 mg/dL (ref 0–99)
Triglycerides: 47 mg/dL (ref 0–149)
VLDL Cholesterol Cal: 10 mg/dL (ref 5–40)

## 2020-05-14 LAB — TSH: TSH: 1.52 u[IU]/mL (ref 0.450–4.500)

## 2020-05-14 LAB — T4, FREE: Free T4: 1.2 ng/dL (ref 0.82–1.77)

## 2020-05-14 NOTE — Assessment & Plan Note (Signed)
Established problem Controlled Lipid Panel     Component Value Date/Time   CHOL 176 05/13/2020 1744   TRIG 47 05/13/2020 1744   HDL 76 05/13/2020 1744   CHOLHDL 2.3 05/13/2020 1744   CHOLHDL 3.5 06/08/2015 1114   VLDL 11 06/08/2015 1114   LDLCALC 90 05/13/2020 1744   LDLDIRECT 112 (H) 12/31/2012 0837   LABVLDL 10 05/13/2020 1744   Tolerating atorvastatin 20 mg. Continue statin therapy RTC 1 yr Continue current medications and other regiments

## 2020-05-14 NOTE — Assessment & Plan Note (Signed)
Established problem Controlled Continue metformin mg BID and other regiments RTC 6 months

## 2020-05-14 NOTE — Assessment & Plan Note (Signed)
Established problem Controlled  Basic Metabolic Panel:    Component Value Date/Time   NA 139 05/13/2020 1744   K 3.8 05/13/2020 1744   CL 98 05/13/2020 1744   CO2 25 05/13/2020 1744   BUN 13 05/13/2020 1744   CREATININE 1.01 (H) 05/13/2020 1744   CREATININE 0.67 12/31/2015 0951   GLUCOSE 133 (H) 05/13/2020 1744   GLUCOSE 150 (H) 03/25/2018 1446   CALCIUM 10.2 05/13/2020 1744  No evidence of renal changes on meds Continue Losartan 50 mg and HCTZ 12.5. RTC 6 months

## 2020-05-14 NOTE — Progress Notes (Signed)
Visit Problem List with A/P  Diabetes mellitus type 2, controlled ([place]) Established problem Controlled Continue metformin mg BID and other regiments RTC 6 months   Hypertension associated with diabetes ([place]) Established problem Controlled  Basic Metabolic Panel:    Component Value Date/Time   NA 139 05/13/2020 1744   K 3.8 05/13/2020 1744   CL 98 05/13/2020 1744   CO2 25 05/13/2020 1744   BUN 13 05/13/2020 1744   CREATININE 1.01 (H) 05/13/2020 1744   CREATININE 0.67 12/31/2015 0951   GLUCOSE 133 (H) 05/13/2020 1744   GLUCOSE 150 (H) 03/25/2018 1446   CALCIUM 10.2 05/13/2020 1744  No evidence of renal changes on meds Continue Losartan 50 mg and HCTZ 12.5. RTC 6 months     Dyslipidemia Established problem Controlled Lipid Panel     Component Value Date/Time   CHOL 176 05/13/2020 1744   TRIG 47 05/13/2020 1744   HDL 76 05/13/2020 1744   CHOLHDL 2.3 05/13/2020 1744   CHOLHDL 3.5 06/08/2015 1114   VLDL 11 06/08/2015 1114   LDLCALC 90 05/13/2020 1744   LDLDIRECT 112 (H) 12/31/2012 0837   LABVLDL 10 05/13/2020 1744   Tolerating atorvastatin 20 mg. Continue statin therapy RTC 1 yr Continue current medications and other regiments   Treated toxic multinodular goiter Established problem. Stable. TSH and FT4 within normal limits.  No evidence of hypothyroidism. Recheck TSH one year.

## 2020-05-14 NOTE — Assessment & Plan Note (Signed)
Established problem. Stable. TSH and FT4 within normal limits.  No evidence of hypothyroidism. Recheck TSH one year.

## 2020-06-08 MED FILL — metFORMIN HCL 1000 MG TABS: 1000 | 30 days supply | Qty: 60 | Fill #3

## 2020-06-08 MED FILL — HYDROCHLOROTHIAZIDE 25 MG T: 25 | 90 days supply | Qty: 90 | Fill #1

## 2020-06-08 MED FILL — LOSARTAN POTASSIUM 50 MG TA: 50 | 90 days supply | Qty: 90 | Fill #1

## 2020-06-22 ENCOUNTER — Other Ambulatory Visit: Payer: Self-pay

## 2020-06-22 ENCOUNTER — Ambulatory Visit (AMBULATORY_SURGERY_CENTER): Payer: Self-pay | Admitting: *Deleted

## 2020-06-22 VITALS — Ht 62.0 in | Wt 216.0 lb

## 2020-06-22 DIAGNOSIS — Z1211 Encounter for screening for malignant neoplasm of colon: Secondary | ICD-10-CM

## 2020-06-22 MED ORDER — SUTAB 1479-225-188 MG PO TABS: 1.0000 | ORAL_TABLET | ORAL | 0 refills | Status: DC

## 2020-06-22 NOTE — Patient Instructions (Signed)
No los °

## 2020-06-22 NOTE — Progress Notes (Signed)
Patient denies any allergies to egg or soy products. Patient denies complications with anesthesia/sedation.  Patient denies oxygen use at home and denies diet medications. Emmi instructions for colonoscopy explained and given to patient.  Patient had both covid vaccinations.  

## 2020-06-23 MED FILL — SUTAB 1479-225-188 MG TABS: 1479-225-18 | 1 days supply | Qty: 24 | Fill #0

## 2020-07-05 ENCOUNTER — Other Ambulatory Visit: Payer: Self-pay

## 2020-07-05 ENCOUNTER — Encounter: Payer: Self-pay | Admitting: Gastroenterology

## 2020-07-05 ENCOUNTER — Ambulatory Visit (AMBULATORY_SURGERY_CENTER): Payer: No Typology Code available for payment source | Admitting: Gastroenterology

## 2020-07-05 VITALS — BP 151/67 | HR 60 | Temp 96.6°F | Resp 19 | Ht 62.0 in | Wt 216.0 lb

## 2020-07-05 DIAGNOSIS — D127 Benign neoplasm of rectosigmoid junction: Secondary | ICD-10-CM

## 2020-07-05 DIAGNOSIS — D122 Benign neoplasm of ascending colon: Secondary | ICD-10-CM

## 2020-07-05 DIAGNOSIS — D125 Benign neoplasm of sigmoid colon: Secondary | ICD-10-CM | POA: Diagnosis not present

## 2020-07-05 DIAGNOSIS — K635 Polyp of colon: Secondary | ICD-10-CM

## 2020-07-05 DIAGNOSIS — Z1211 Encounter for screening for malignant neoplasm of colon: Secondary | ICD-10-CM | POA: Diagnosis present

## 2020-07-05 DIAGNOSIS — D120 Benign neoplasm of cecum: Secondary | ICD-10-CM

## 2020-07-05 MED ORDER — SODIUM CHLORIDE 0.9 % IV SOLN: 500.0000 mL | Freq: Once | INTRAVENOUS | Status: DC

## 2020-07-05 NOTE — Op Note (Signed)
[facility] Patient Name: Jasmine Dickson Procedure Date: 07/05/2020 3:34 PM MRN: [id number] Endoscopist: Remo Lipps P. Havery Moros , MD Age: 59 Referring MD:  Date of Birth: 20-Sep-1961 Gender: Female Account #: [id number] Procedure:                Colonoscopy Indications:              Screening for colorectal malignant neoplasm, This                            is the patient's first colonoscopy Medicines:                Monitored Anesthesia Care Procedure:                Pre-Anesthesia Assessment:                           - Prior to the procedure, a History and Physical                            was performed, and patient medications and                            allergies were reviewed. The patient's tolerance of                            previous anesthesia was also reviewed. The risks                            and benefits of the procedure and the sedation                            options and risks were discussed with the patient.                            All questions were answered, and informed consent                            was obtained. Prior Anticoagulants: The patient has                            taken no previous anticoagulant or antiplatelet                            agents. ASA Grade Assessment: III - A patient with                            severe systemic disease. After reviewing the risks                            and benefits, the patient was deemed in                            satisfactory condition to undergo the procedure.  After obtaining informed consent, the colonoscope                            was passed under direct vision. Throughout the                            procedure, the patient's blood pressure, pulse, and                            oxygen saturations were monitored continuously. The                            Colonoscope was introduced through the anus and                            advanced to the the  cecum, identified by                            appendiceal orifice and ileocecal valve. The                            colonoscopy was performed without difficulty. The                            patient tolerated the procedure well. The quality                            of the bowel preparation was adequate. The                            ileocecal valve, appendiceal orifice, and rectum                            were photographed. Scope In: 3:50:35 PM Scope Out: 4:14:28 PM Scope Withdrawal Time: 0 hours 20 minutes 58 seconds  Total Procedure Duration: 0 hours 23 minutes 53 seconds  Findings:                 The perianal and digital rectal examinations were                            normal.                           A 6 mm polyp was found in the appendiceal orifice                            and appeared to be invading the appendix, could not                            appreciate clear borders. The polyp was flat.                            Biopsies were taken with a cold forceps for  histology.                           A 3 mm polyp was found in the ascending colon. The                            polyp was sessile. The polyp was removed with a                            cold biopsy forceps. Resection and retrieval were                            complete.                           A 3 mm polyp was found in the sigmoid colon. The                            polyp was flat. The polyp was removed with a cold                            biopsy forceps. Resection and retrieval were                            complete.                           A 3 mm polyp was found in the recto-sigmoid colon.                            The polyp was sessile. The polyp was removed with a                            cold biopsy forceps. Resection and retrieval were                            complete.                           A few small-mouthed diverticula were found in the                             left colon and right colon.                           The exam was otherwise without abnormality. Complications:            No immediate complications. Estimated blood loss:                            Minimal. Estimated Blood Loss:     Estimated blood loss was minimal. Impression:               - One 6 mm polyp at the appendiceal orifice.  Biopsied as outlined, concern for invasion into the                            appendix.                           - One 3 mm polyp in the ascending colon, removed                            with a cold biopsy forceps. Resected and retrieved.                           - One 3 mm polyp in the sigmoid colon, removed with                            a cold biopsy forceps. Resected and retrieved.                           - One 3 mm polyp at the recto-sigmoid colon,                            removed with a cold biopsy forceps. Resected and                            retrieved.                           - Diverticulosis in the left colon and in the right                            colon.                           - The examination was otherwise normal. Recommendation:           - Patient has a contact number available for                            emergencies. The signs and symptoms of potential                            delayed complications were discussed with the                            patient. Return to normal activities tomorrow.                            Written discharge instructions were provided to the                            patient.                           - Resume previous diet.                           -  Continue present medications.                           - Await pathology results.                           - Patient may likely warrant appendectomy (assuming                            no prior appendectomy) to treat this polyp Remo Lipps P. Shadara Lopez, MD 07/05/2020 4:21:17 PM This  report has been signed electronically.

## 2020-07-05 NOTE — Progress Notes (Signed)
Pt's states no medical or surgical changes since previsit or office visit.   V/S-CW  Check-in-JB 

## 2020-07-05 NOTE — Progress Notes (Signed)
Called to room to assist during endoscopic procedure.  Patient ID and intended procedure confirmed with present staff. Received instructions for my participation in the procedure from the performing physician.  

## 2020-07-05 NOTE — Progress Notes (Signed)
A and O x3. Report to RN. Tolerated MAC anesthesia well.

## 2020-07-05 NOTE — Patient Instructions (Signed)
Handouts provided on polyps and diverticulosis.  ? ?YOU HAD AN ENDOSCOPIC PROCEDURE TODAY AT THE [facility]:   Refer to the procedure report that was given to you for any specific questions about what was found during the examination.  If the procedure report does not answer your questions, please call your gastroenterologist to clarify.  If you requested that your care partner not be given the details of your procedure findings, then the procedure report has been included in a sealed envelope for you to review at your convenience later. ? ?YOU SHOULD EXPECT: Some feelings of bloating in the abdomen. Passage of more gas than usual.  Walking can help get rid of the air that was put into your GI tract during the procedure and reduce the bloating. If you had a lower endoscopy (such as a colonoscopy or flexible sigmoidoscopy) you may notice spotting of blood in your stool or on the toilet paper. If you underwent a bowel prep for your procedure, you may not have a normal bowel movement for a few days. ? ?Please Note:  You might notice some irritation and congestion in your nose or some drainage.  This is from the oxygen used during your procedure.  There is no need for concern and it should clear up in a day or so. ? ?SYMPTOMS TO REPORT IMMEDIATELY: ? ?Following lower endoscopy (colonoscopy or flexible sigmoidoscopy): ? Excessive amounts of blood in the stool ? Significant tenderness or worsening of abdominal pains ? Swelling of the abdomen that is new, acute ? Fever of 100?F or higher ? ?For urgent or emergent issues, a gastroenterologist can be reached at any hour by calling [phone]. ?Do not use MyChart messaging for urgent concerns.  ? ? ?DIET:  We do recommend a small meal at first, but then you may proceed to your regular diet.  Drink plenty of fluids but you should avoid alcoholic beverages for 24 hours. ? ?ACTIVITY:  You should plan to take it easy for the rest of today and you should NOT  DRIVE or use heavy machinery until tomorrow (because of the sedation medicines used during the test).   ? ?FOLLOW UP: ?Our staff will call the number listed on your records 48-72 hours following your procedure to check on you and address any questions or concerns that you may have regarding the information given to you following your procedure. If we do not reach you, we will leave a message.  We will attempt to reach you two times.  During this call, we will ask if you have developed any symptoms of COVID 19. If you develop any symptoms (ie: fever, flu-like symptoms, shortness of breath, cough etc.) before then, please call [phone].  If you test positive for Covid 19 in the 2 weeks post procedure, please call and report this information to us.   ? ?If any biopsies were taken you will be contacted by phone or by letter within the next 1-3 weeks.  Please call us at [phone] if you have not heard about the biopsies in 3 weeks.  ? ? ?SIGNATURES/CONFIDENTIALITY: ?You and/or your care partner have signed paperwork which will be entered into your electronic medical record.  These signatures attest to the fact that that the information above on your After Visit Summary has been reviewed and is understood.  Full responsibility of the confidentiality of this discharge information lies with you and/or your care-partner. ? ?

## 2020-07-07 ENCOUNTER — Telehealth: Payer: Self-pay

## 2020-07-07 ENCOUNTER — Encounter: Payer: Self-pay | Admitting: Gastroenterology

## 2020-07-07 NOTE — Telephone Encounter (Signed)
  Follow up Call-  Call back number 07/05/2020  Post procedure Call Back phone  # [phone]  Permission to leave phone message Yes  Some recent data might be hidden     Left message

## 2020-07-07 NOTE — Telephone Encounter (Signed)
  Follow up Call-  Call back number 07/05/2020  Post procedure Call Back phone  # [phone]  Permission to leave phone message Yes  Some recent data might be hidden     Patient questions:  Do you have a fever, pain , or abdominal swelling? No. Pain Score  0 *  Have you tolerated food without any problems? Yes.    Have you been able to return to your normal activities? Yes.    Do you have any questions about your discharge instructions: Diet   No. Medications  No. Follow up visit  No.  Do you have questions or concerns about your Care? No.  Actions: * If pain score is 4 or above: No action needed, pain <4.  1. Have you developed a fever since your procedure? no  2.   Have you had an respiratory symptoms (SOB or cough) since your procedure? no  3.   Have you tested positive for COVID 19 since your procedure no  4.   Have you had any family members/close contacts diagnosed with the COVID 19 since your procedure?  no   If yes to any of these questions please route to Joylene John, RN and Erenest Rasher, RN

## 2020-07-07 NOTE — Telephone Encounter (Signed)
Pt left message with answering service this morning stating that she was returning your call. Pls call pt again.

## 2020-07-15 ENCOUNTER — Telehealth: Payer: Self-pay | Admitting: Gastroenterology

## 2020-07-15 NOTE — Telephone Encounter (Signed)
Pt states she has a list of providers that she was given, pt is requesting a call back from [facility]

## 2020-07-15 NOTE — Telephone Encounter (Signed)
Pt called stating that [facility] does not take her isurance. She is asking if she can be referred somewhere else. Pls call her at  [phone].

## 2020-07-15 NOTE — Telephone Encounter (Signed)
Patient asked to contact her insurance for a participating surgeon in our area.  She will call me back once she hears back from them.

## 2020-07-15 NOTE — Telephone Encounter (Signed)
Patient has been referred to [facility] for possible appendectomy.  She is aware that she will be contacted by them once they review.

## 2020-07-26 MED FILL — metFORMIN HCL 1000 MG TABS: 1000 | 30 days supply | Qty: 60 | Fill #4

## 2020-08-13 MED FILL — HYDROCODON-APAP 5-325: 5-325 | 4 days supply | Qty: 16 | Fill #0

## 2020-09-06 ENCOUNTER — Other Ambulatory Visit: Payer: Self-pay | Admitting: Family Medicine

## 2020-09-06 DIAGNOSIS — Z1231 Encounter for screening mammogram for malignant neoplasm of breast: Secondary | ICD-10-CM

## 2020-09-13 MED FILL — LOSARTAN POTASSIUM 50 MG TA: 50 | 90 days supply | Qty: 90 | Fill #2

## 2020-09-13 MED FILL — metFORMIN HCL 1000 MG TABS: 1000 | 30 days supply | Qty: 60 | Fill #5

## 2020-09-21 ENCOUNTER — Other Ambulatory Visit: Payer: Self-pay

## 2020-09-21 ENCOUNTER — Ambulatory Visit
Admission: RE | Admit: 2020-09-21 | Discharge: 2020-09-21 | Disposition: A | Discharge status: Home / Self Care | Payer: No Typology Code available for payment source | Source: Ambulatory Visit | Attending: Family Medicine | Admitting: Family Medicine

## 2020-09-21 DIAGNOSIS — Z1231 Encounter for screening mammogram for malignant neoplasm of breast: Secondary | ICD-10-CM

## 2020-09-24 ENCOUNTER — Other Ambulatory Visit: Payer: Self-pay | Admitting: Family Medicine

## 2020-09-24 DIAGNOSIS — R928 Other abnormal and inconclusive findings on diagnostic imaging of breast: Secondary | ICD-10-CM

## 2020-10-01 ENCOUNTER — Other Ambulatory Visit: Payer: No Typology Code available for payment source

## 2020-10-04 ENCOUNTER — Ambulatory Visit
Admission: RE | Admit: 2020-10-04 | Discharge: 2020-10-04 | Disposition: A | Discharge status: Home / Self Care | Payer: No Typology Code available for payment source | Source: Ambulatory Visit | Attending: Family Medicine | Admitting: Family Medicine

## 2020-10-04 ENCOUNTER — Other Ambulatory Visit: Payer: Self-pay

## 2020-10-04 ENCOUNTER — Ambulatory Visit: Payer: No Typology Code available for payment source

## 2020-10-04 DIAGNOSIS — R928 Other abnormal and inconclusive findings on diagnostic imaging of breast: Secondary | ICD-10-CM

## 2020-10-16 ENCOUNTER — Ambulatory Visit: Payer: No Typology Code available for payment source | Attending: Internal Medicine

## 2020-10-16 DIAGNOSIS — Z23 Encounter for immunization: Secondary | ICD-10-CM

## 2020-10-16 NOTE — Progress Notes (Signed)
   Covid-19 Vaccination Clinic  Name:  Jasmine Dickson    MRN: [id number] DOB: [birth year]  10/16/2020  [name] was observed post Covid-19 immunization for 15 minutes without incident. She was provided with Vaccine Information Sheet and instruction to access the V-Safe system.   [name] was instructed to call 911 with any severe reactions post vaccine: Marland Kitchen Difficulty breathing  . Swelling of face and throat  . A fast heartbeat  . A bad rash all over body  . Dizziness and weakness

## 2020-10-22 MED FILL — METFORMIN HCL 1000 MG TABS: 1000 | 30 days supply | Qty: 60 | Fill #0

## 2020-12-07 MED FILL — METFORMIN HCL 1000 MG TABS: 1000 | 30 days supply | Qty: 60 | Fill #1

## 2020-12-07 MED FILL — LOSARTAN POTASSIUM 50 MG TA: 50 | 90 days supply | Qty: 90 | Fill #3

## 2020-12-08 MED FILL — HYDROCHLOROTHIAZIDE 25 MG T: 25 | 90 days supply | Qty: 90 | Fill #2

## 2021-01-18 MED FILL — METFORMIN HCL 1000 MG TABS: 1000 | 30 days supply | Qty: 60 | Fill #2

## 2021-03-01 ENCOUNTER — Other Ambulatory Visit: Payer: Self-pay | Admitting: Family Medicine

## 2021-03-01 DIAGNOSIS — I152 Hypertension secondary to endocrine disorders: Secondary | ICD-10-CM

## 2021-03-01 DIAGNOSIS — E119 Type 2 diabetes mellitus without complications: Secondary | ICD-10-CM

## 2021-03-01 DIAGNOSIS — E1159 Type 2 diabetes mellitus with other circulatory complications: Secondary | ICD-10-CM

## 2021-03-19 ENCOUNTER — Other Ambulatory Visit (HOSPITAL_COMMUNITY): Payer: Self-pay

## 2021-04-11 ENCOUNTER — Other Ambulatory Visit (HOSPITAL_COMMUNITY): Payer: Self-pay

## 2021-04-11 MED FILL — Losartan Potassium Tab 50 MG: ORAL | 60 days supply | Qty: 60 | Fill #0 | Status: AC

## 2021-04-11 MED FILL — Metformin HCl Tab 1000 MG: ORAL | 60 days supply | Qty: 120 | Fill #0 | Status: AC

## 2021-06-14 ENCOUNTER — Other Ambulatory Visit (HOSPITAL_COMMUNITY): Payer: Self-pay

## 2021-06-14 ENCOUNTER — Other Ambulatory Visit: Payer: Self-pay | Admitting: Family Medicine

## 2021-06-14 DIAGNOSIS — I152 Hypertension secondary to endocrine disorders: Secondary | ICD-10-CM

## 2021-06-14 MED ORDER — LOSARTAN POTASSIUM 50 MG PO TABS
50.0000 mg | ORAL_TABLET | Freq: Every day | ORAL | 0 refills | Status: DC
  Filled 2021-06-14: qty 90, 90d supply, fill #0

## 2021-06-14 MED ORDER — HYDROCHLOROTHIAZIDE 25 MG PO TABS
25.0000 mg | ORAL_TABLET | Freq: Every day | ORAL | 0 refills | Status: DC
  Filled 2021-06-14: qty 90, 90d supply, fill #0

## 2021-06-14 NOTE — Telephone Encounter (Signed)
Needs to be seen before further refills

## 2021-06-24 ENCOUNTER — Other Ambulatory Visit: Payer: Self-pay | Admitting: Family Medicine

## 2021-06-24 ENCOUNTER — Other Ambulatory Visit (HOSPITAL_COMMUNITY): Payer: Self-pay

## 2021-06-24 DIAGNOSIS — E119 Type 2 diabetes mellitus without complications: Secondary | ICD-10-CM

## 2021-06-30 ENCOUNTER — Other Ambulatory Visit: Payer: Self-pay

## 2021-06-30 ENCOUNTER — Other Ambulatory Visit (HOSPITAL_COMMUNITY): Payer: Self-pay

## 2021-06-30 ENCOUNTER — Ambulatory Visit (INDEPENDENT_AMBULATORY_CARE_PROVIDER_SITE_OTHER): Payer: No Typology Code available for payment source | Admitting: Family Medicine

## 2021-06-30 ENCOUNTER — Encounter: Payer: Self-pay | Admitting: Family Medicine

## 2021-06-30 VITALS — BP 130/72 | HR 81 | Ht 63.0 in | Wt 226.0 lb

## 2021-06-30 DIAGNOSIS — E119 Type 2 diabetes mellitus without complications: Secondary | ICD-10-CM

## 2021-06-30 DIAGNOSIS — Z6839 Body mass index (BMI) 39.0-39.9, adult: Secondary | ICD-10-CM | POA: Diagnosis not present

## 2021-06-30 DIAGNOSIS — E785 Hyperlipidemia, unspecified: Secondary | ICD-10-CM

## 2021-06-30 DIAGNOSIS — E0520 Thyrotoxicosis with toxic multinodular goiter without thyrotoxic crisis or storm: Secondary | ICD-10-CM

## 2021-06-30 DIAGNOSIS — E1159 Type 2 diabetes mellitus with other circulatory complications: Secondary | ICD-10-CM | POA: Diagnosis not present

## 2021-06-30 DIAGNOSIS — Z23 Encounter for immunization: Secondary | ICD-10-CM | POA: Diagnosis not present

## 2021-06-30 DIAGNOSIS — I152 Hypertension secondary to endocrine disorders: Secondary | ICD-10-CM

## 2021-06-30 DIAGNOSIS — E1169 Type 2 diabetes mellitus with other specified complication: Secondary | ICD-10-CM | POA: Diagnosis not present

## 2021-06-30 DIAGNOSIS — Z6841 Body Mass Index (BMI) 40.0 and over, adult: Secondary | ICD-10-CM

## 2021-06-30 DIAGNOSIS — L84 Corns and callosities: Secondary | ICD-10-CM

## 2021-06-30 DIAGNOSIS — Z79899 Other long term (current) drug therapy: Secondary | ICD-10-CM

## 2021-06-30 LAB — POCT GLYCOSYLATED HEMOGLOBIN (HGB A1C): HbA1c, POC (controlled diabetic range): 7.3 % — AB (ref 0.0–7.0)

## 2021-06-30 MED ORDER — METFORMIN HCL 1000 MG PO TABS
1000.0000 mg | ORAL_TABLET | Freq: Two times a day (BID) | ORAL | 3 refills | Status: DC
  Filled 2021-06-30 – 2021-07-01 (×2): qty 180, 90d supply, fill #0
  Filled 2021-10-04: qty 180, 90d supply, fill #1
  Filled 2022-01-05: qty 180, 90d supply, fill #2
  Filled 2022-03-30: qty 180, 90d supply, fill #3

## 2021-06-30 MED ORDER — HYDROCHLOROTHIAZIDE 25 MG PO TABS
25.0000 mg | ORAL_TABLET | Freq: Every day | ORAL | 3 refills | Status: DC
  Filled 2021-06-30 – 2021-10-06 (×2): qty 90, 90d supply, fill #0
  Filled 2022-01-04: qty 90, 90d supply, fill #1
  Filled 2022-03-30: qty 90, 90d supply, fill #2

## 2021-06-30 MED ORDER — LOSARTAN POTASSIUM 50 MG PO TABS
50.0000 mg | ORAL_TABLET | Freq: Every day | ORAL | 3 refills | Status: DC
  Filled 2021-06-30: qty 90, 90d supply, fill #0
  Filled 2021-09-08: qty 30, 30d supply, fill #0
  Filled 2021-10-06: qty 30, 30d supply, fill #1
  Filled 2021-11-08: qty 30, 30d supply, fill #2
  Filled 2021-12-07: qty 30, 30d supply, fill #3
  Filled 2022-01-04: qty 30, 30d supply, fill #4
  Filled 2022-02-08: qty 30, 30d supply, fill #5
  Filled 2022-03-08: qty 30, 30d supply, fill #6
  Filled 2022-04-07: qty 30, 30d supply, fill #7
  Filled 2022-05-08: qty 30, 30d supply, fill #8
  Filled 2022-06-07: qty 30, 30d supply, fill #9

## 2021-06-30 NOTE — Patient Instructions (Signed)
Your blood pressure looks good. Your A1c is still under fair control.  I image when you are back on your metformin that it will go back down below 7% like it was in the past.   You got your Pneumonia (Prevnar-20) vaccination today.  You will not need another until you are over age 59.    It is alright to use the callus creams from the drug store.  Just make sure to keep it mostly on the callus.  It can take a couple months of daily use to completely remove the callus.   If your cholesterol is not under good control with the fish oil, you may want to be seen at the [facility].  They are up on all the new cholesterol lowering meds besides statins.    You just ask for the Shingles vaccination at your drugstore.  You do not need a prescription from your doctor.  Wait one month after your pneumonia vaccination to get your shinlges vaccination.  Another cause of confusion is that all your laboratory results may not show up at the same time.   Your doctor may be waiting for multiple results to finally show up before your doctor can dicuss the results with you.     Please give [place] 72 hours in order for your doctor to thoroughly review all the results before contacting the office for clarification of your results.  If everything is normal, you will get a letter in the mail or a message in My Chart.  If there is a test or imaging study that is concerning to your doctor, they will call you to discuss them.   Please give [place] a call if you do not hear from [place] after 2 weeks.

## 2021-07-01 ENCOUNTER — Encounter: Payer: Self-pay | Admitting: Family Medicine

## 2021-07-01 ENCOUNTER — Other Ambulatory Visit (HOSPITAL_COMMUNITY): Payer: Self-pay

## 2021-07-01 DIAGNOSIS — L84 Corns and callosities: Secondary | ICD-10-CM

## 2021-07-01 HISTORY — DX: Corns and callosities: L84

## 2021-07-01 LAB — BASIC METABOLIC PANEL
BUN/Creatinine Ratio: 19 (ref 9–23)
BUN: 15 mg/dL (ref 6–24)
CO2: 26 mmol/L (ref 20–29)
Calcium: 10.3 mg/dL — ABNORMAL HIGH (ref 8.7–10.2)
Chloride: 98 mmol/L (ref 96–106)
Creatinine, Ser: 0.79 mg/dL (ref 0.57–1.00)
Glucose: 137 mg/dL — ABNORMAL HIGH (ref 65–99)
Potassium: 3.8 mmol/L (ref 3.5–5.2)
Sodium: 140 mmol/L (ref 134–144)
eGFR: 87 mL/min/{1.73_m2} (ref >59)

## 2021-07-01 LAB — LIPID PANEL
Chol/HDL Ratio: 3.1 ratio (ref 0.0–4.4)
Cholesterol, Total: 219 mg/dL — ABNORMAL HIGH (ref 100–199)
HDL: 71 mg/dL (ref >39)
LDL Chol Calc (NIH): 135 mg/dL — ABNORMAL HIGH (ref 0–99)
Triglycerides: 72 mg/dL (ref 0–149)
VLDL Cholesterol Cal: 13 mg/dL (ref 5–40)

## 2021-07-01 LAB — TSH: TSH: 1.17 u[IU]/mL (ref 0.450–4.500)

## 2021-07-01 NOTE — Assessment & Plan Note (Signed)
Lipid Panel     Component Value Date/Time   CHOL 219 (H) 06/30/2021 1446   TRIG 72 06/30/2021 1446   HDL 71 06/30/2021 1446   CHOLHDL 3.1 06/30/2021 1446   CHOLHDL 3.5 06/08/2015 1114   VLDL 11 06/08/2015 1114   LDLCALC 135 (H) 06/30/2021 1446   LDLDIRECT 112 (H) 12/31/2012 0837   LABVLDL 13 06/30/2021 1446  Stopped statin bc of leg muscle aching.   She feels that she does not tolerate statins  She is willing to go to the [facility] to explore her treatment options.

## 2021-07-01 NOTE — Assessment & Plan Note (Signed)
Established problem Well Controlled. Lab Results  Component Value Date   HGBA1C 7.3 (A) 06/30/2021  Up slightly from last year  Ran out of metformin in May.   No signs of complications, medication side effects, or red flags. Continue current medications and other regiments.  Plan Restart of Metformin Increase physical activity

## 2021-07-01 NOTE — Assessment & Plan Note (Signed)
Established problem Unchanged Recommend topical keratolytic (oTC)

## 2021-07-01 NOTE — Assessment & Plan Note (Addendum)
Established problem. Stable. No signs or symptoms of hypothyroidism Monitoring TSH for emergence of hypothyroidism Lab Results  Component Value Date   TSH 1.170 06/30/2021  No evidence of hypothyroidism.

## 2021-07-01 NOTE — Assessment & Plan Note (Signed)
Established problem Well Controlled. No signs of complications, medication side effects, or red flags. Continue current medications and other regiments.  

## 2021-07-01 NOTE — Assessment & Plan Note (Addendum)
Established problem worsened.  Jasmine Dickson has not been going to gym bc of Covid risk She identified sweets as contributing to weight gain.  This would be an area of intervention.

## 2021-07-01 NOTE — Progress Notes (Signed)
Jasmine Dickson is alone Sources of clinical information for visit is/are patient and past medical records. Nursing assessment for this office visit was reviewed with the patient for accuracy and revision.  [facility] office visit 04/2020   Previous Report(s) Reviewed: lab reports and office notes  Depression screen [facility] 2/9 06/30/2021  Decreased Interest 0  Down, Depressed, Hopeless 0  PHQ - 2 Score 0  Altered sleeping 0  Tired, decreased energy 0  Change in appetite 0  Feeling bad or failure about yourself  0  Trouble concentrating 0  Moving slowly or fidgety/restless 0  Suicidal thoughts 0  PHQ-9 Score 0    No flowsheet data found.  PHQ9 SCORE ONLY 06/30/2021 05/13/2020 08/28/2019  PHQ-9 Total Score 0 0 0    Adult vaccines due  Topic Date Due   TETANUS/TDAP  05/31/2028   PNEUMOCOCCAL POLYSACCHARIDE VACCINE AGE 53-64 HIGH RISK  Completed    Health Maintenance Due  Topic Date Due   Zoster Vaccines- Shingrix (1 of 2) Never done   Pneumococcal Vaccine 87-21 Years old (2 - PCV) 09/29/1999   OPHTHALMOLOGY EXAM  10/05/2013   FOOT EXAM  08/27/2020      History/P.E. limitations: none-  Adult vaccines due  Topic Date Due   TETANUS/TDAP  05/31/2028   PNEUMOCOCCAL POLYSACCHARIDE VACCINE AGE 53-64 HIGH RISK  Completed    Diabetes Health Maintenance Due  Topic Date Due   OPHTHALMOLOGY EXAM  10/05/2013   FOOT EXAM  08/27/2020   HEMOGLOBIN A1C  12/31/2021    Health Maintenance Due  Topic Date Due   Zoster Vaccines- Shingrix (1 of 2) Never done   Pneumococcal Vaccine 39-73 Years old (2 - PCV) 09/29/1999   OPHTHALMOLOGY EXAM  10/05/2013   FOOT EXAM  08/27/2020     Chief Complaint  Patient presents with   Diabetes

## 2021-09-08 ENCOUNTER — Other Ambulatory Visit (HOSPITAL_COMMUNITY): Payer: Self-pay

## 2021-10-04 ENCOUNTER — Other Ambulatory Visit (HOSPITAL_COMMUNITY): Payer: Self-pay

## 2021-10-06 ENCOUNTER — Other Ambulatory Visit (HOSPITAL_COMMUNITY): Payer: Self-pay

## 2021-11-08 ENCOUNTER — Other Ambulatory Visit (HOSPITAL_COMMUNITY): Payer: Self-pay

## 2021-12-07 ENCOUNTER — Other Ambulatory Visit (HOSPITAL_COMMUNITY): Payer: Self-pay

## 2022-01-05 ENCOUNTER — Other Ambulatory Visit (HOSPITAL_COMMUNITY): Payer: Self-pay

## 2022-02-08 ENCOUNTER — Other Ambulatory Visit (HOSPITAL_COMMUNITY): Payer: Self-pay

## 2022-03-08 ENCOUNTER — Other Ambulatory Visit (HOSPITAL_COMMUNITY): Payer: Self-pay

## 2022-03-30 ENCOUNTER — Other Ambulatory Visit (HOSPITAL_COMMUNITY): Payer: Self-pay

## 2022-04-05 ENCOUNTER — Ambulatory Visit: Payer: No Typology Code available for payment source | Admitting: Family Medicine

## 2022-04-05 NOTE — Patient Instructions (Incomplete)
If you had blood tests today, Dr Emberlin Verner will send you a MyChart message or a letter if the results are normal.  Otherwise, he will give you a call.  ? ?If Dr Raetta Agostinelli ordered you to have a referral, you should hear from the referral's office to schedule an appointment.  Referrals were made to the Lipid (Cholesterol) clinic to help treat your hypercholesterolemia, and to the eye doctor to make sure there is no diabetes problems in your eyes, and to [facility] for your mammogram to check for breast cancer.  ? ?If you have not heard from the referral's office within 5 business-days, please let Dr Keene Gilkey know by sending him a message through [place], or calling the [facility] [phone]) to leave him a message.  ? ? ?

## 2022-04-06 ENCOUNTER — Ambulatory Visit (INDEPENDENT_AMBULATORY_CARE_PROVIDER_SITE_OTHER): Payer: No Typology Code available for payment source | Admitting: Family Medicine

## 2022-04-06 ENCOUNTER — Encounter: Payer: Self-pay | Admitting: Family Medicine

## 2022-04-06 ENCOUNTER — Ambulatory Visit (INDEPENDENT_AMBULATORY_CARE_PROVIDER_SITE_OTHER): Payer: No Typology Code available for payment source

## 2022-04-06 VITALS — BP 137/79 | HR 83 | Ht 63.0 in | Wt 220.4 lb

## 2022-04-06 DIAGNOSIS — E119 Type 2 diabetes mellitus without complications: Secondary | ICD-10-CM

## 2022-04-06 DIAGNOSIS — E0520 Thyrotoxicosis with toxic multinodular goiter without thyrotoxic crisis or storm: Secondary | ICD-10-CM | POA: Diagnosis not present

## 2022-04-06 DIAGNOSIS — I152 Hypertension secondary to endocrine disorders: Secondary | ICD-10-CM

## 2022-04-06 DIAGNOSIS — Z79899 Other long term (current) drug therapy: Secondary | ICD-10-CM

## 2022-04-06 DIAGNOSIS — E1169 Type 2 diabetes mellitus with other specified complication: Secondary | ICD-10-CM | POA: Diagnosis not present

## 2022-04-06 DIAGNOSIS — E1159 Type 2 diabetes mellitus with other circulatory complications: Secondary | ICD-10-CM | POA: Diagnosis not present

## 2022-04-06 DIAGNOSIS — Z23 Encounter for immunization: Secondary | ICD-10-CM

## 2022-04-06 DIAGNOSIS — Z0000 Encounter for general adult medical examination without abnormal findings: Secondary | ICD-10-CM

## 2022-04-06 DIAGNOSIS — E785 Hyperlipidemia, unspecified: Secondary | ICD-10-CM

## 2022-04-06 LAB — POCT GLYCOSYLATED HEMOGLOBIN (HGB A1C): HbA1c, POC (controlled diabetic range): 7 % (ref 0.0–7.0)

## 2022-04-06 NOTE — Assessment & Plan Note (Addendum)
-  COVID-19 booster today ?-Patient states she will schedule a mammogram and eye exam.  ?

## 2022-04-06 NOTE — Patient Instructions (Addendum)
Nice to meet you today!  ? ?Your blood pressure looks good today and your A1c today is 7.0, which is also good.  ? ?We will plan to start a GLP-1 receptor agonist injection for your diabetes. We will figure out which one is least expensive on your insurance. Once you begin this and are able to move up slowly in dose, we will discuss a timeline for decreasing the metformin. ? ?We will place a referral to the lipid clinic, who will be able to give you information about the different options to reduce your cholesterol.   ? ?For your left hand tingling, if it is waking you up at night, consider using a brace. We have attached additional information about carpal tunnel, which may be causing these symptoms. ? ?Please schedule your mammogram and eye exam.  ? ?

## 2022-04-06 NOTE — Assessment & Plan Note (Signed)
Will check lipids today ?Given statin intolerance, will place referral to lipid clinic.  ?

## 2022-04-06 NOTE — Progress Notes (Signed)
?  Date of Visit: 04/06/2022  ? ?SUBJECTIVE:  ? ?HPI: ? ?Jasmine Dickson is a 61 year old female with hypertension, type II diabetes mellitus, hyperlipidemia, and a history of non-toxic goiter who presents today for routine follow-up ? ?Diabetes:  ?Her A1c today is 7.0, down from 7.3 in July 2022. She does not check home sugars, but has been trying to eat well and exercise, despite time limitations of working two jobs. She continues to take metformin '1000mg'$  twice a day, but states that it gives her occasional diarrhea. She endorses episodic (less than monthly) abdominal pain and diarrhea that she believes began when she started metformin in 1989. She denies any blood in her stool. She has lost 6 lbs since July of 2022, attributed to diet and exercise. She has not received an eye exam, but plans to call to set one up. She is interested in beginning a GLP-1 injectable for diabetes and weight loss, and if possible, would like to come off her metformin.  ? ?HTN: ?Blood pressure today was 137/79. She is adherent with her blood pressure medication regimen, and does not check home BP.  ? ?HLD:  ?History of adverse reaction (myalgias) with statin. She is willing to consider lipid clinic depending on cost and location.  ? ?Left hand tingling: ?She describes a several year history of her left hand tingling and "falling asleep" at night that is unresponsive to tylenol or NSAIDs.  ? ?OBJECTIVE:  ? ?BP 137/79   Pulse 83   Ht '5\' 3"'$  (1.6 m)   Wt 220 lb 6.4 oz (100 kg)   LMP  (LMP Unknown)   SpO2 100%   BMI 39.04 kg/m?  ?Gen: well-appearing, in no distress ?HEENT: NCAT ?Heart: Normal S1, S2, no murmurs ?Lungs: Normal breath sounds bilaterally, no increased work of breathing ?Neuro: alert, normal speech ?Ext: no lower extremity edema ? ?ASSESSMENT/PLAN:  ? ?Hypertension associated with diabetes ([place]) ?Established problem ?Well controlled ?No signs of complications, side effects, or red flags. Continue current antihypertensive regimen.   ? ?Diabetes mellitus type 2, controlled ([place]) ?Established problem, well-controlled ?Given occasional diarrhea that patient believes is associated with metformin and given comorbid obesity, will start a GLP-1 receptor agonist. Will confirm with Dr. Valentina Lucks about which one will be least expensive on her insurance.  ? ?Hyperlipidemia associated with type 2 diabetes mellitus ([place]) ?Will check lipids today ?Given statin intolerance, will place referral to lipid clinic.  ? ?Healthcare maintenance ?-COVID-19 booster today ?-Patient states she will schedule a mammogram and eye exam.  ? ?Left Hand Tingling ?-Likely carpal tunnel ?-Provided information on bracing and supportive care.  ? ?FOLLOW UP: ?Follow up in 1 month to see how GLP-1 injection is going.  ? ?Annia Belt, Medical Student ?[place] ? ?

## 2022-04-06 NOTE — Assessment & Plan Note (Signed)
Established problem ?Well controlled ?No signs of complications, side effects, or red flags. Continue current antihypertensive regimen.  ?

## 2022-04-06 NOTE — Assessment & Plan Note (Addendum)
Established problem, well-controlled ?Given occasional diarrhea that patient believes is associated with metformin and given comorbid obesity, will start a GLP-1 receptor agonist. Will confirm with Dr. Valentina Lucks about which one will be least expensive on her insurance.  ?

## 2022-04-07 ENCOUNTER — Other Ambulatory Visit (HOSPITAL_COMMUNITY): Payer: Self-pay

## 2022-04-07 LAB — BASIC METABOLIC PANEL
BUN/Creatinine Ratio: 19 (ref 9–23)
BUN: 14 mg/dL (ref 6–24)
CO2: 25 mmol/L (ref 20–29)
Calcium: 9.9 mg/dL (ref 8.7–10.2)
Chloride: 99 mmol/L (ref 96–106)
Creatinine, Ser: 0.74 mg/dL (ref 0.57–1.00)
Glucose: 123 mg/dL — ABNORMAL HIGH (ref 70–99)
Potassium: 3.6 mmol/L (ref 3.5–5.2)
Sodium: 140 mmol/L (ref 134–144)
eGFR: 93 mL/min/{1.73_m2} (ref >59)

## 2022-04-07 LAB — MICROALBUMIN, URINE: Microalbumin, Urine: 4.4 ug/mL

## 2022-04-07 LAB — TSH: TSH: 1.23 u[IU]/mL (ref 0.450–4.500)

## 2022-04-07 MED ORDER — ZOSTER VAC RECOMB ADJUVANTED 50 MCG/0.5ML IM SUSR
INTRAMUSCULAR | 1 refills | Status: DC
  Filled 2022-04-07: qty 0.5, 1d supply, fill #0
  Filled 2022-06-13: qty 0.5, 1d supply, fill #1

## 2022-04-10 NOTE — Progress Notes (Signed)
I have interviewed and examined the patient with MSIII Jaffe.   I agree with their documentation and management in their note for today.  ? ? ? ? ?

## 2022-04-12 ENCOUNTER — Other Ambulatory Visit (HOSPITAL_COMMUNITY): Payer: Self-pay

## 2022-04-12 ENCOUNTER — Other Ambulatory Visit: Payer: Self-pay | Admitting: Family Medicine

## 2022-04-12 DIAGNOSIS — Z1231 Encounter for screening mammogram for malignant neoplasm of breast: Secondary | ICD-10-CM

## 2022-04-14 ENCOUNTER — Ambulatory Visit: Payer: No Typology Code available for payment source

## 2022-04-19 ENCOUNTER — Ambulatory Visit
Admission: RE | Admit: 2022-04-19 | Discharge: 2022-04-19 | Disposition: A | Discharge status: Home / Self Care | Payer: BC Managed Care – PPO | Source: Ambulatory Visit | Attending: Family Medicine | Admitting: Family Medicine

## 2022-04-19 DIAGNOSIS — Z1231 Encounter for screening mammogram for malignant neoplasm of breast: Secondary | ICD-10-CM

## 2022-05-08 ENCOUNTER — Other Ambulatory Visit (HOSPITAL_COMMUNITY): Payer: Self-pay

## 2022-05-23 ENCOUNTER — Encounter: Payer: Self-pay | Admitting: *Deleted

## 2022-06-07 ENCOUNTER — Other Ambulatory Visit (HOSPITAL_COMMUNITY): Payer: Self-pay

## 2022-06-13 ENCOUNTER — Other Ambulatory Visit (HOSPITAL_COMMUNITY): Payer: Self-pay

## 2022-07-13 ENCOUNTER — Other Ambulatory Visit (HOSPITAL_COMMUNITY): Payer: Self-pay

## 2022-07-13 ENCOUNTER — Other Ambulatory Visit: Payer: Self-pay | Admitting: Family Medicine

## 2022-07-13 DIAGNOSIS — E119 Type 2 diabetes mellitus without complications: Secondary | ICD-10-CM

## 2022-07-13 DIAGNOSIS — E1159 Type 2 diabetes mellitus with other circulatory complications: Secondary | ICD-10-CM

## 2022-07-13 MED ORDER — HYDROCHLOROTHIAZIDE 25 MG PO TABS
25.0000 mg | ORAL_TABLET | Freq: Every day | ORAL | 3 refills | Status: DC
  Filled 2022-07-13: qty 90, 90d supply, fill #0

## 2022-07-13 MED ORDER — LOSARTAN POTASSIUM 50 MG PO TABS
50.0000 mg | ORAL_TABLET | Freq: Every day | ORAL | 3 refills | Status: DC
  Filled 2022-07-13: qty 90, 90d supply, fill #0

## 2022-07-13 MED ORDER — METFORMIN HCL 1000 MG PO TABS
1000.0000 mg | ORAL_TABLET | Freq: Two times a day (BID) | ORAL | 3 refills | Status: DC
  Filled 2022-07-13: qty 180, 90d supply, fill #0

## 2022-10-05 ENCOUNTER — Other Ambulatory Visit (HOSPITAL_COMMUNITY): Payer: Self-pay

## 2022-10-05 ENCOUNTER — Encounter: Payer: Self-pay | Admitting: Family Medicine

## 2022-10-05 ENCOUNTER — Ambulatory Visit: Payer: BC Managed Care – PPO | Admitting: Family Medicine

## 2022-10-05 VITALS — BP 130/78 | HR 75 | Ht 63.0 in | Wt 221.4 lb

## 2022-10-05 DIAGNOSIS — Z6841 Body Mass Index (BMI) 40.0 and over, adult: Secondary | ICD-10-CM

## 2022-10-05 DIAGNOSIS — I152 Hypertension secondary to endocrine disorders: Secondary | ICD-10-CM

## 2022-10-05 DIAGNOSIS — Z0000 Encounter for general adult medical examination without abnormal findings: Secondary | ICD-10-CM

## 2022-10-05 DIAGNOSIS — E1159 Type 2 diabetes mellitus with other circulatory complications: Secondary | ICD-10-CM

## 2022-10-05 DIAGNOSIS — E119 Type 2 diabetes mellitus without complications: Secondary | ICD-10-CM

## 2022-10-05 LAB — POCT GLYCOSYLATED HEMOGLOBIN (HGB A1C): HbA1c, POC (controlled diabetic range): 7.4 % — AB (ref 0.0–7.0)

## 2022-10-05 MED ORDER — LOSARTAN POTASSIUM 50 MG PO TABS
50.0000 mg | ORAL_TABLET | Freq: Every day | ORAL | 3 refills | Status: DC
  Filled 2022-10-05: qty 90, 90d supply, fill #0
  Filled 2023-01-01 – 2023-01-11 (×2): qty 90, 90d supply, fill #1
  Filled 2023-04-05: qty 90, 90d supply, fill #2
  Filled 2023-07-09: qty 90, 90d supply, fill #3

## 2022-10-05 MED ORDER — METFORMIN HCL 1000 MG PO TABS
1000.0000 mg | ORAL_TABLET | Freq: Two times a day (BID) | ORAL | 3 refills | Status: DC
  Filled 2022-10-05: qty 180, 90d supply, fill #0
  Filled 2023-01-11: qty 180, 90d supply, fill #1
  Filled 2023-05-03: qty 180, 90d supply, fill #2
  Filled 2023-08-03: qty 180, 90d supply, fill #3

## 2022-10-05 MED ORDER — HYDROCHLOROTHIAZIDE 25 MG PO TABS
25.0000 mg | ORAL_TABLET | Freq: Every day | ORAL | 3 refills | Status: DC
  Filled 2022-10-05: qty 90, 90d supply, fill #0
  Filled 2023-01-11: qty 90, 90d supply, fill #1
  Filled 2023-04-05: qty 90, 90d supply, fill #2
  Filled 2023-07-09: qty 90, 90d supply, fill #3

## 2022-10-05 NOTE — Assessment & Plan Note (Addendum)
A1c 7.4% Established problem Well Controlled and is at goal of A1c. No signs of complications, medication side effects, or red flags. Continue current medications and other regiments. Patient plans to work on diet.

## 2022-10-05 NOTE — Assessment & Plan Note (Addendum)
Established problem Stable weight.  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  She knows which foods, like starches, are contributing to her weight. She does not want to try a GLP-1 because the risk of upsetting her stomach which has always been a problem for her.  She plans to work on diet.  Follow up in 6 months

## 2022-10-05 NOTE — Progress Notes (Signed)
Marko Stai is alone Sources of clinical information for visit is/are patient. Nursing assessment for this office visit was reviewed with the patient for accuracy and revision.     Previous Report(s) Reviewed: none     10/05/2022    9:09 AM  Depression screen PHQ 2/9  Decreased Interest 0  Down, Depressed, Hopeless 0  PHQ - 2 Score 0  Tired, decreased energy 0  Change in appetite 0  Feeling bad or failure about yourself  0  Trouble concentrating 0  Moving slowly or fidgety/restless 0  Suicidal thoughts 0   Flowsheet Row Office Visit from 10/05/2022 in [place] Office Visit from 04/06/2022 in [place] Office Visit from 06/30/2021 in [facility]  Thoughts that you would be better off dead, or of hurting yourself in some way Not at all Not at all Not at all  PHQ-9 Total Score -- 0 0           No data to display             10/05/2022    9:09 AM 04/06/2022    2:43 PM 06/30/2021    1:28 PM  PHQ9 SCORE ONLY  PHQ-9 Total Score 0 0 0    Adult vaccines due  Topic Date Due   TETANUS/TDAP  05/31/2028    Health Maintenance Due  Topic Date Due   Diabetic kidney evaluation - Urine ACR  Never done   OPHTHALMOLOGY EXAM  10/05/2013   COVID-19 Vaccine (5 - Pfizer risk series) 06/01/2022      History/P.E. limitations: none  Adult vaccines due  Topic Date Due   TETANUS/TDAP  05/31/2028    Diabetes Health Maintenance Due  Topic Date Due   OPHTHALMOLOGY EXAM  10/05/2013   HEMOGLOBIN A1C  04/06/2023   FOOT EXAM  10/06/2023    Health Maintenance Due  Topic Date Due   Diabetic kidney evaluation - Urine ACR  Never done   OPHTHALMOLOGY EXAM  10/05/2013   COVID-19 Vaccine (5 - Pfizer risk series) 06/01/2022     Chief Complaint  Patient presents with   Follow-up    A1C   Hypertension   Diabetes    Diabetic Foot Exam - Simple   Simple Foot Form Diabetic Foot exam was performed with the  following findings: Yes 10/05/2022 11:20 AM  Visual Inspection See comments: Yes Sensation Testing Intact to touch and monofilament testing bilaterally: Yes Pulse Check Posterior Tibialis and Dorsalis pulse intact bilaterally: Yes Comments Right foot lateral 5th ray head skin with callous without evidence of ulceration nor inflammation.     --------------------------------------------------------------------------------------------------------------------------------------------- Visit Problem List with A/P  Morbid obesity with BMI of 40.0-44.9, adult ([place]) Established problem Stable weight.  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  She knows which foods, like starches, are contributing to her weight. She does not want to try a GLP-1 because the risk of upsetting her stomach which has always been a problem for her.  She plans to work on diet.  Follow up in 6 months   Hypertension associated with diabetes ([place]) Established problem Well Controlled and is at goal BP. No signs of complications, medication side effects, or red flags. Continue current medications and other regiments.   Diabetes mellitus type 2, controlled ([place]) A1c 7.4% Established problem Well Controlled and is at goal of A1c. No signs of complications, medication side effects, or red flags. Continue current medications and other regiments. Patient  plans to work on diet.   Preventative health care Encouraged Ms Dampier to go for her diabetic eye exam with her optometrist.

## 2022-10-05 NOTE — Assessment & Plan Note (Signed)
Encouraged Ms Patin to go for her diabetic eye exam with her optometrist.

## 2022-10-05 NOTE — Assessment & Plan Note (Signed)
Established problem Well Controlled and is at goal BP. No signs of complications, medication side effects, or red flags. Continue current medications and other regiments.

## 2022-10-05 NOTE — Patient Instructions (Addendum)
Your blood pressure is 130/78 which is good.  Keep taking your blood pressure medications.   Your A1c is 7.4% which is in the good range, but it is up a little from April when it was 7.0%.  Your weight IS stable. It was 221 pounds today. It was 220 pounds in April.   Please get your eyes examined and tested for glaucoma.

## 2022-10-06 ENCOUNTER — Encounter: Payer: Self-pay | Admitting: Family Medicine

## 2022-10-06 LAB — MICROALBUMIN / CREATININE URINE RATIO
Creatinine, Urine: 122.7 mg/dL
Microalb/Creat Ratio: 6 mg/g creat (ref 0–29)
Microalbumin, Urine: 7.9 ug/mL

## 2023-01-11 ENCOUNTER — Other Ambulatory Visit (HOSPITAL_COMMUNITY): Payer: Self-pay

## 2023-01-12 ENCOUNTER — Other Ambulatory Visit (HOSPITAL_COMMUNITY): Payer: Self-pay

## 2023-01-12 ENCOUNTER — Other Ambulatory Visit: Payer: Self-pay

## 2023-05-03 ENCOUNTER — Other Ambulatory Visit (HOSPITAL_COMMUNITY): Payer: Self-pay

## 2023-06-19 ENCOUNTER — Encounter: Payer: Self-pay | Admitting: Gastroenterology

## 2023-07-30 ENCOUNTER — Other Ambulatory Visit: Payer: Self-pay | Admitting: Family Medicine

## 2023-07-30 DIAGNOSIS — Z1231 Encounter for screening mammogram for malignant neoplasm of breast: Secondary | ICD-10-CM

## 2023-07-31 ENCOUNTER — Ambulatory Visit: Payer: BC Managed Care – PPO

## 2023-08-03 ENCOUNTER — Ambulatory Visit
Admission: RE | Admit: 2023-08-03 | Discharge: 2023-08-03 | Disposition: A | Discharge status: Home / Self Care | Payer: BC Managed Care – PPO | Source: Ambulatory Visit | Attending: Family Medicine | Admitting: Family Medicine

## 2023-08-03 ENCOUNTER — Other Ambulatory Visit (HOSPITAL_COMMUNITY): Payer: Self-pay

## 2023-08-03 DIAGNOSIS — Z1231 Encounter for screening mammogram for malignant neoplasm of breast: Secondary | ICD-10-CM

## 2023-10-04 ENCOUNTER — Other Ambulatory Visit (HOSPITAL_COMMUNITY): Payer: Self-pay

## 2023-10-04 ENCOUNTER — Other Ambulatory Visit: Payer: Self-pay | Admitting: Family Medicine

## 2023-10-04 DIAGNOSIS — E1159 Type 2 diabetes mellitus with other circulatory complications: Secondary | ICD-10-CM

## 2023-10-04 MED ORDER — LOSARTAN POTASSIUM 50 MG PO TABS
50.0000 mg | ORAL_TABLET | Freq: Every day | ORAL | 3 refills | Status: DC
Start: 2023-10-04 — End: 2024-09-15
  Filled 2023-10-04: qty 90, 90d supply, fill #0
  Filled 2024-01-03 – 2024-01-04 (×2): qty 90, 90d supply, fill #1
  Filled 2024-04-02 – 2024-04-03 (×2): qty 90, 90d supply, fill #2
  Filled 2024-06-27: qty 90, 90d supply, fill #3

## 2023-10-11 ENCOUNTER — Other Ambulatory Visit (HOSPITAL_COMMUNITY): Payer: Self-pay

## 2023-10-11 ENCOUNTER — Encounter: Payer: Self-pay | Admitting: Family Medicine

## 2023-10-11 ENCOUNTER — Ambulatory Visit: Payer: BC Managed Care – PPO | Admitting: Family Medicine

## 2023-10-11 VITALS — BP 137/71 | HR 69 | Ht 63.0 in | Wt 233.8 lb

## 2023-10-11 DIAGNOSIS — E785 Hyperlipidemia, unspecified: Secondary | ICD-10-CM | POA: Diagnosis not present

## 2023-10-11 DIAGNOSIS — E0520 Thyrotoxicosis with toxic multinodular goiter without thyrotoxic crisis or storm: Secondary | ICD-10-CM

## 2023-10-11 DIAGNOSIS — Z79899 Other long term (current) drug therapy: Secondary | ICD-10-CM

## 2023-10-11 DIAGNOSIS — I152 Hypertension secondary to endocrine disorders: Secondary | ICD-10-CM

## 2023-10-11 DIAGNOSIS — E119 Type 2 diabetes mellitus without complications: Secondary | ICD-10-CM

## 2023-10-11 DIAGNOSIS — G720 Drug-induced myopathy: Secondary | ICD-10-CM | POA: Diagnosis not present

## 2023-10-11 DIAGNOSIS — Z6841 Body Mass Index (BMI) 40.0 and over, adult: Secondary | ICD-10-CM

## 2023-10-11 DIAGNOSIS — E11649 Type 2 diabetes mellitus with hypoglycemia without coma: Secondary | ICD-10-CM

## 2023-10-11 DIAGNOSIS — Z23 Encounter for immunization: Secondary | ICD-10-CM

## 2023-10-11 DIAGNOSIS — E1159 Type 2 diabetes mellitus with other circulatory complications: Secondary | ICD-10-CM

## 2023-10-11 DIAGNOSIS — E1169 Type 2 diabetes mellitus with other specified complication: Secondary | ICD-10-CM | POA: Diagnosis not present

## 2023-10-11 DIAGNOSIS — M791 Myalgia, unspecified site: Secondary | ICD-10-CM

## 2023-10-11 DIAGNOSIS — K635 Polyp of colon: Secondary | ICD-10-CM

## 2023-10-11 LAB — POCT GLYCOSYLATED HEMOGLOBIN (HGB A1C): HbA1c, POC (controlled diabetic range): 9 % — AB (ref 0.0–7.0)

## 2023-10-11 MED ORDER — RYBELSUS 3 MG PO TABS
1.0000 | ORAL_TABLET | Freq: Every day | ORAL | 0 refills | Status: AC
  Filled 2023-10-11: qty 30, 30d supply, fill #0

## 2023-10-11 NOTE — Patient Instructions (Addendum)
It was good seeing you Jasmine Dickson.    Your A1c was 9.0% which is up from 7.4% last year.  I would like to see your A1c less than 7.5%.  Start Rybelsus, 3 mg tablet, one tablet daily for thirty days.  On your last week of the 3 mg tablets, send a MyChart to Dr Graycie Halley to let me know how you are doing.  If all is going well, then will send in a prescription for the next higher dose of Rybelsus, 7 mg tablet daily for 30 days.   A referral was sent to the Lipid Clinic to help with your high cholesterol levels.   We are checking your kidneys, and electrolytes, your thyroid and cholesterol. .   Dr Kanye Depree will let you know it they are abnormal.  If they are normal, he will send your a message through your MyChart

## 2023-10-11 NOTE — Progress Notes (Signed)
Jasmine Dickson is alone Sources of clinical information for visit is/are patient. Nursing assessment for this office visit was reviewed with the patient for accuracy and revision.     Previous Report(s) Reviewed: none     10/11/2023   10:03 AM  Depression screen PHQ 2/9  Decreased Interest 0  Down, Depressed, Hopeless 0  PHQ - 2 Score 0  Altered sleeping 0  Tired, decreased energy 0  Change in appetite 0  Feeling bad or failure about yourself  0  Trouble concentrating 0  Moving slowly or fidgety/restless 0  Suicidal thoughts 0  PHQ-9 Score 0  Difficult doing work/chores Not difficult at all   [employer] Office Visit from 10/11/2023 in [facility] Office Visit from 10/05/2022 in [facility] Office Visit from 04/06/2022 in [facility]  Thoughts that you would be better off dead, or of hurting yourself in some way Not at all Not at all Not at all  PHQ-9 Total Score 0 -- 0           No data to display             10/11/2023   10:03 AM 10/05/2022    9:09 AM 04/06/2022    2:43 PM  PHQ9 SCORE ONLY  PHQ-9 Total Score 0 0 0    There are no preventive care reminders to display for this patient.  Health Maintenance Due  Topic Date Due   OPHTHALMOLOGY EXAM  10/05/2013   Diabetic kidney evaluation - eGFR measurement  04/07/2023   Colonoscopy  07/06/2023   Diabetic kidney evaluation - Urine ACR  10/06/2023      History/P.E. limitations: none  There are no preventive care reminders to display for this patient.  Diabetes Health Maintenance Due  Topic Date Due   OPHTHALMOLOGY EXAM  10/05/2013   HEMOGLOBIN A1C  04/10/2024    Health Maintenance Due  Topic Date Due   OPHTHALMOLOGY EXAM  10/05/2013   Diabetic kidney evaluation - eGFR measurement  04/07/2023   Colonoscopy  07/06/2023   Diabetic kidney evaluation - Urine ACR  10/06/2023     Chief Complaint  Patient presents with   Diabetes    Hypertension     --------------------------------------------------------------------------------------------------------------------------------------------- Visit Problem List with A/P  No problem-specific Assessment & Plan notes found for this encounter.

## 2023-10-12 ENCOUNTER — Other Ambulatory Visit (HOSPITAL_COMMUNITY): Payer: Self-pay

## 2023-10-12 ENCOUNTER — Encounter: Payer: Self-pay | Admitting: Family Medicine

## 2023-10-12 LAB — RENAL FUNCTION PANEL
Albumin: 4.1 g/dL (ref 3.9–4.9)
BUN/Creatinine Ratio: 19 (ref 12–28)
BUN: 12 mg/dL (ref 8–27)
CO2: 25 mmol/L (ref 20–29)
Calcium: 10.1 mg/dL (ref 8.7–10.3)
Chloride: 97 mmol/L (ref 96–106)
Creatinine, Ser: 0.64 mg/dL (ref 0.57–1.00)
Glucose: 153 mg/dL — ABNORMAL HIGH (ref 70–99)
Phosphorus: 3.4 mg/dL (ref 3.0–4.3)
Potassium: 4.1 mmol/L (ref 3.5–5.2)
Sodium: 137 mmol/L (ref 134–144)
eGFR: 100 mL/min/{1.73_m2} (ref >59)

## 2023-10-12 LAB — LIPID PANEL
Chol/HDL Ratio: 3.1 ratio (ref 0.0–4.4)
Cholesterol, Total: 210 mg/dL — ABNORMAL HIGH (ref 100–199)
HDL: 67 mg/dL (ref >39)
LDL Chol Calc (NIH): 132 mg/dL — ABNORMAL HIGH (ref 0–99)
Triglycerides: 59 mg/dL (ref 0–149)
VLDL Cholesterol Cal: 11 mg/dL (ref 5–40)

## 2023-10-12 LAB — MICROALBUMIN / CREATININE URINE RATIO
Creatinine, Urine: 130.7 mg/dL
Microalb/Creat Ratio: 11 mg/g{creat} (ref 0–29)
Microalbumin, Urine: 14.7 ug/mL

## 2023-10-12 LAB — TSH: TSH: 0.977 u[IU]/mL (ref 0.450–4.500)

## 2023-10-12 NOTE — Assessment & Plan Note (Signed)
Lab Results  Component Value Date   CHOL 210 (H) 10/11/2023   CHOL 219 (H) 06/30/2021   CHOL 176 05/13/2020   Lab Results  Component Value Date   HDL 67 10/11/2023   HDL 71 06/30/2021   HDL 76 05/13/2020   Lab Results  Component Value Date   LDLCALC 132 (H) 10/11/2023   LDLCALC 135 (H) 06/30/2021   LDLCALC 90 05/13/2020   Lab Results  Component Value Date   TRIG 59 10/11/2023   TRIG 72 06/30/2021   TRIG 47 05/13/2020   Lab Results  Component Value Date   CHOLHDL 3.1 10/11/2023   CHOLHDL 3.1 06/30/2021   CHOLHDL 2.3 05/13/2020   Lab Results  Component Value Date   LDLDIRECT 112 (H) 12/31/2012   LDLDIRECT 126 (H) 11/22/2011  The 10-year ASCVD risk score (Arnett DK, et al., 2019) is: 18.7%   Values used to calculate the score:     Age: 62 years     Sex: Female     Is Non-Hispanic African American: Yes     Diabetic: Yes     Tobacco smoker: No     Systolic Blood Pressure: 137 mmHg     Is BP treated: Yes     HDL Cholesterol: 67 mg/dL     Total Cholesterol: 210 mg/dL Jasmine Dickson is not at goal of being on a cholesterol lowering medication Jasmine Dickson has been intolerant of atorvastatin and rosuvastatin because of muscle aching.   She agreed to consulting with the Lipid clinic to explore her treatment options

## 2023-10-12 NOTE — Assessment & Plan Note (Signed)
Established problem Wt Readings from Last 3 Encounters:  10/11/23 233 lb 12.8 oz (106.1 kg)  10/05/22 221 lb 6 oz (100.4 kg)  04/06/22 220 lb 6.4 oz (100 kg)   Uncontrolled We discussed important role of diet in weight loss She declines nutritional consultation at this time, feeling she knows what she needs to do in her diet.  The Rybelsus will hopefully have some impact onher weight to give her a sense of success

## 2023-10-12 NOTE — Assessment & Plan Note (Signed)
Lab Results  Component Value Date   TSH 0.977 10/11/2023   No evidence of hypothyroidism. Monitor annually to biannually

## 2023-10-12 NOTE — Assessment & Plan Note (Addendum)
Lab Results  Component Value Date   HGBA1C 9.0 (A) 10/11/2023   Established problem Uncontrolled.  Patient is not at goal of A1c < 7.5%. Start: Rybelsus 3 mg with monthly contact by MyChart to review tolerance and titrate dose upward. Recheck A1c ~ 1/25.25,   Continue: Metformin 1000 mg twice a day  UACR pending

## 2023-10-12 NOTE — Assessment & Plan Note (Signed)
Established problem Well Controlled. Patient is at goal of < 140/90. No signs of complications, medication side effects, or red flags. Continue current medications and other regiments.  

## 2023-10-24 ENCOUNTER — Other Ambulatory Visit (HOSPITAL_COMMUNITY): Payer: Self-pay

## 2023-10-31 ENCOUNTER — Other Ambulatory Visit (HOSPITAL_COMMUNITY): Payer: Self-pay

## 2023-10-31 ENCOUNTER — Other Ambulatory Visit: Payer: Self-pay | Admitting: Family Medicine

## 2023-10-31 DIAGNOSIS — E1159 Type 2 diabetes mellitus with other circulatory complications: Secondary | ICD-10-CM

## 2023-10-31 DIAGNOSIS — E119 Type 2 diabetes mellitus without complications: Secondary | ICD-10-CM

## 2023-10-31 MED ORDER — METFORMIN HCL 1000 MG PO TABS
1000.0000 mg | ORAL_TABLET | Freq: Two times a day (BID) | ORAL | 3 refills | Status: AC
Start: 2023-10-31 — End: ?
  Filled 2023-10-31: qty 180, 90d supply, fill #0
  Filled 2024-01-29: qty 180, 90d supply, fill #1

## 2023-10-31 MED ORDER — HYDROCHLOROTHIAZIDE 25 MG PO TABS
25.0000 mg | ORAL_TABLET | Freq: Every day | ORAL | 3 refills | Status: DC
  Filled 2023-10-31: qty 90, 90d supply, fill #0
  Filled 2024-01-29: qty 90, 90d supply, fill #1
  Filled 2024-05-02: qty 90, 90d supply, fill #2
  Filled 2024-07-30: qty 90, 90d supply, fill #3

## 2024-01-04 ENCOUNTER — Other Ambulatory Visit (HOSPITAL_COMMUNITY): Payer: Self-pay

## 2024-01-31 ENCOUNTER — Encounter: Payer: Self-pay | Admitting: Family Medicine

## 2024-01-31 ENCOUNTER — Other Ambulatory Visit (HOSPITAL_COMMUNITY): Payer: Self-pay

## 2024-01-31 ENCOUNTER — Other Ambulatory Visit: Payer: Self-pay

## 2024-01-31 ENCOUNTER — Ambulatory Visit (INDEPENDENT_AMBULATORY_CARE_PROVIDER_SITE_OTHER): Payer: BC Managed Care – PPO | Admitting: Family Medicine

## 2024-01-31 VITALS — BP 130/75 | HR 78 | Ht 63.0 in | Wt 230.2 lb

## 2024-01-31 DIAGNOSIS — E1169 Type 2 diabetes mellitus with other specified complication: Secondary | ICD-10-CM

## 2024-01-31 DIAGNOSIS — E1159 Type 2 diabetes mellitus with other circulatory complications: Secondary | ICD-10-CM

## 2024-01-31 DIAGNOSIS — E119 Type 2 diabetes mellitus without complications: Secondary | ICD-10-CM | POA: Diagnosis not present

## 2024-01-31 DIAGNOSIS — D126 Benign neoplasm of colon, unspecified: Secondary | ICD-10-CM

## 2024-01-31 DIAGNOSIS — E785 Hyperlipidemia, unspecified: Secondary | ICD-10-CM

## 2024-01-31 DIAGNOSIS — E1165 Type 2 diabetes mellitus with hyperglycemia: Secondary | ICD-10-CM | POA: Diagnosis not present

## 2024-01-31 DIAGNOSIS — G720 Drug-induced myopathy: Secondary | ICD-10-CM

## 2024-01-31 DIAGNOSIS — I152 Hypertension secondary to endocrine disorders: Secondary | ICD-10-CM

## 2024-01-31 LAB — POCT GLYCOSYLATED HEMOGLOBIN (HGB A1C): HbA1c, POC (controlled diabetic range): 8.8 % — AB (ref 0.0–7.0)

## 2024-01-31 MED ORDER — METFORMIN HCL 1000 MG PO TABS
ORAL_TABLET | ORAL | 3 refills | Status: AC
Start: 2024-01-31 — End: ?
  Filled 2024-01-31 (×2): qty 225, 90d supply, fill #0
  Filled 2024-05-02: qty 225, 90d supply, fill #1
  Filled 2024-08-05: qty 225, 90d supply, fill #2
  Filled 2024-11-14: qty 225, 90d supply, fill #3

## 2024-01-31 MED ORDER — EMPAGLIFLOZIN 10 MG PO TABS
10.0000 mg | ORAL_TABLET | Freq: Every day | ORAL | 2 refills | Status: DC
Start: 2024-01-31 — End: 2024-02-01
  Filled 2024-01-31 – 2024-02-01 (×2): qty 30, 30d supply, fill #0

## 2024-01-31 MED ORDER — ATORVASTATIN CALCIUM 10 MG PO TABS
5.0000 mg | ORAL_TABLET | ORAL | 0 refills | Status: DC
  Filled 2024-01-31: qty 6, 84d supply, fill #0

## 2024-01-31 NOTE — Patient Instructions (Addendum)
Jasmine Dickson -   Your A1c is 8.8% which is just about the same as your last check (9.0%).    Increase your metformin to one tablet in the morning and 1 & 1/2 tablet in the evening.  Start a new medication, empagliflozine (Jardiance), 10 mg tablet, one tablet each morning.  It will help you pee out excess sugar.  It also protects your heart and kidneys from damage by the diabetes.  It can cause yeast infection and bladder infections, so let Dr Kayleb Warshaw know if you think you are developing either of these side effects.   Please try the cholesterol lowering medication called atorvastatin. You have had muscle aches with this statin medication in the past, but at a much higher and more frequent dose than you will be trying this time.   Please take a half of a atorvastatin 10 mg tablet once a week. If you tolerate this dose and frequency, it would be good to try the same half-tablet twice a week. We can talk about going up on the dose at your next office visit.   Your blood pressure is under good control.  Please keep taking your hydrochlorothiazide 25 mg tablet once a day and Losartan 50 mg tablet once a day.    Next office visit we should check your cholesterol, kidneys, potassium, and electrolytes with a blood test.   I will ask our pharmacy team if there is a way to get the Rybelsus medication from the manufacturer at for free or at a significantly reduced cost.

## 2024-01-31 NOTE — Progress Notes (Unsigned)
Jasmine Dickson is {Pc accompanied by:5710} Sources of clinical information for visit is/are {Information source:60032}. Nursing assessment for this office visit was reviewed with the patient for accuracy and revision.     Previous Report(s) Reviewed: {Outside review:15817}     10/11/2023   10:03 AM  Depression screen PHQ 2/9  Decreased Interest 0  Down, Depressed, Hopeless 0  PHQ - 2 Score 0  Altered sleeping 0  Tired, decreased energy 0  Change in appetite 0  Feeling bad or failure about yourself  0  Trouble concentrating 0  Moving slowly or fidgety/restless 0  Suicidal thoughts 0  PHQ-9 Score 0  Difficult doing work/chores Not difficult at all   [employer] Office Visit from 10/11/2023 in [facility] [place]. [facility] Office Visit from 10/05/2022 in [facility] [place]. [facility] Office Visit from 04/06/2022 in [facility] Of Eligha Bridegroom. [facility]  Thoughts that you would be better off dead, or of hurting yourself in some way Not at all Not at all Not at all  PHQ-9 Total Score 0 -- 0           No data to display             10/11/2023   10:03 AM 10/05/2022    9:09 AM 04/06/2022    2:43 PM  PHQ9 SCORE ONLY  PHQ-9 Total Score 0 0 0    There are no preventive care reminders to display for this patient.  Health Maintenance Due  Topic Date Due   OPHTHALMOLOGY EXAM  10/05/2013   Colonoscopy  07/06/2023   COVID-19 Vaccine (6 - 2024-25 season) 12/06/2023      History/P.E. limitations: {exam; limitations ed:60112}  There are no preventive care reminders to display for this patient.  Diabetes Health Maintenance Due  Topic Date Due   OPHTHALMOLOGY EXAM  10/05/2013   HEMOGLOBIN A1C  04/10/2024    Health Maintenance Due  Topic Date Due   OPHTHALMOLOGY EXAM  10/05/2013   Colonoscopy  07/06/2023   COVID-19 Vaccine (6 - 2024-25 season) 12/06/2023      No chief complaint on file.    --------------------------------------------------------------------------------------------------------------------------------------------- Visit Problem List with A/P  No problem-specific Assessment & Plan notes found for this encounter.

## 2024-02-01 ENCOUNTER — Telehealth: Payer: Self-pay

## 2024-02-01 ENCOUNTER — Encounter: Payer: Self-pay | Admitting: Family Medicine

## 2024-02-01 ENCOUNTER — Other Ambulatory Visit (HOSPITAL_COMMUNITY): Payer: Self-pay

## 2024-02-01 DIAGNOSIS — D126 Benign neoplasm of colon, unspecified: Secondary | ICD-10-CM | POA: Insufficient documentation

## 2024-02-01 DIAGNOSIS — G720 Drug-induced myopathy: Secondary | ICD-10-CM

## 2024-02-01 DIAGNOSIS — E1165 Type 2 diabetes mellitus with hyperglycemia: Secondary | ICD-10-CM

## 2024-02-01 NOTE — Assessment & Plan Note (Addendum)
Established problem Uncontrolled.  Patient is not at goal of <7.5%. Unfortunately, Jasmine Dickson's current insurer has a high pharmaceutical deductible that makes affording newer antidiabetic medications difficult.    Jasmine Muzio will increase her metformin 1000 mg tab to one in am and 1&1/2 in pm.  Other options to improve glycemic control: Sulfonylurea med may help lower A1c. Insulin would also be an option.  Will ask Pharmacy team if there is a MAP to help with cost of Rybelsus for patient's with insurance who have a high deductible ($5000).  Will DC unaffordable SGLT2i (empagliflozin)   Will DC Prescription for empagliflozin and Rybelsus

## 2024-02-01 NOTE — Telephone Encounter (Signed)
Patient calls nurse line in regards to [place].   She reports [facility] does not have the generic.   I called [facility] and they report they do have the medication in stock, however her copay is 500 dollars.   She reports a PA would not be beneficial as her plan does cover the medication.  I attempted to call patient to inform, however I had to LVM.   Will forward to PCP and pharmacy team for assistance.

## 2024-02-01 NOTE — Assessment & Plan Note (Signed)
Needs 3 year surveillance Colonoscopy since 06/2023 with Dr Adela Lank.

## 2024-02-01 NOTE — Telephone Encounter (Signed)
 I spoke with [facility] outpt pharmacy.  It appears that no matter what is prescribed, Jasmine Dickson will have a $5000 deductible to meet.

## 2024-02-01 NOTE — Assessment & Plan Note (Signed)
Established problem. Adequate blood pressure control.  No evidence of new end organ damage.  Tolerating medication without significant adverse effects.  Plan to continue current blood pressure medication regiment.

## 2024-02-01 NOTE — Assessment & Plan Note (Signed)
Jasmine Dickson felt she was ujnable to afford consultation with Lipid Clinic. She is willing to try a lvery low dose and infrequent dosing of atorvastatin 10 mg tablet, half-tablet (5 mg) once a week.  If tolerate will titrate up to 5 mg twice a week

## 2024-02-05 ENCOUNTER — Other Ambulatory Visit (HOSPITAL_COMMUNITY): Payer: Self-pay

## 2024-02-06 ENCOUNTER — Other Ambulatory Visit (HOSPITAL_COMMUNITY): Payer: Self-pay

## 2024-02-07 ENCOUNTER — Encounter: Payer: Self-pay | Admitting: Family Medicine

## 2024-02-11 ENCOUNTER — Other Ambulatory Visit (HOSPITAL_COMMUNITY): Payer: Self-pay

## 2024-03-05 ENCOUNTER — Other Ambulatory Visit (HOSPITAL_COMMUNITY): Payer: Self-pay

## 2024-03-05 ENCOUNTER — Telehealth: Payer: Self-pay | Admitting: Pharmacist

## 2024-03-05 DIAGNOSIS — E1365 Other specified diabetes mellitus with hyperglycemia: Secondary | ICD-10-CM

## 2024-03-05 MED ORDER — GLIMEPIRIDE 2 MG PO TABS
2.0000 mg | ORAL_TABLET | Freq: Every day | ORAL | 3 refills | Status: AC
  Filled 2024-03-05: qty 90, 90d supply, fill #0

## 2024-03-05 NOTE — Telephone Encounter (Signed)
 Patient contacted for follow-up of diabetes management.  Recent A1C elevated and cost of multiple preferred agents are highly expensive with treatments including SGLT or Ryblesus.   Patient did not see message from Dr. McDiarmid RE Glimepiride.   I shared details on the treatment plan with the patient and will send the prescription to [facility] - 2mg  once daily  Current Medications include:  -Continue Metformin  1000mg  in AM and 1500mg  in PM Patient denies any significant medication related side effects.  Medication Plan: -START glimepiride once in available  Patient plans to follow-up with Dr. McDiarmid in ~ 3 weeks  If control remains suboptimal, (>8 A1C) please consider referral for Liberate study enrollment.  Total time with patient call and documentation of interaction: 18 minutes.

## 2024-03-05 NOTE — Assessment & Plan Note (Signed)
 Patient contacted for follow-up of diabetes management.  Recent A1C elevated and cost of multiple preferred agents are highly expensive with treatments including SGLT or Ryblesus.   Patient did not see message from Dr. McDiarmid RE Glimepiride.   I shared details on the treatment plan with the patient and will send the prescription to [facility] - 2mg  once daily  Current Medications include:  -Continue Metformin  1000mg  in AM and 1500mg  in PM Patient denies any significant medication related side effects.  Medication Plan: -START glimepiride once in available

## 2024-03-06 NOTE — Telephone Encounter (Signed)
 Reviewed and agree with Dr Macky Lower plan.

## 2024-03-17 ENCOUNTER — Other Ambulatory Visit (HOSPITAL_COMMUNITY): Payer: Self-pay

## 2024-04-03 ENCOUNTER — Other Ambulatory Visit (HOSPITAL_BASED_OUTPATIENT_CLINIC_OR_DEPARTMENT_OTHER): Payer: Self-pay

## 2024-04-03 ENCOUNTER — Other Ambulatory Visit (HOSPITAL_COMMUNITY): Payer: Self-pay

## 2024-05-20 ENCOUNTER — Telehealth: Payer: Self-pay | Admitting: Family Medicine

## 2024-05-20 NOTE — Telephone Encounter (Signed)
 Patient was identified as falling into the [place] Measure - Diabetes.   Patient was: Left voicemail to schedule with primary care provider.

## 2024-06-27 ENCOUNTER — Other Ambulatory Visit (HOSPITAL_COMMUNITY): Payer: Self-pay

## 2024-07-01 ENCOUNTER — Other Ambulatory Visit (HOSPITAL_COMMUNITY): Payer: Self-pay

## 2024-07-30 ENCOUNTER — Other Ambulatory Visit (HOSPITAL_COMMUNITY): Payer: Self-pay

## 2024-08-05 ENCOUNTER — Other Ambulatory Visit (HOSPITAL_COMMUNITY): Payer: Self-pay

## 2024-08-15 ENCOUNTER — Telehealth: Payer: Self-pay | Admitting: Family Medicine

## 2024-08-15 NOTE — Telephone Encounter (Signed)
 Patient was identified as falling into the [place] Measure - Diabetes.   Patient was: Left voicemail to schedule with primary care provider.

## 2024-09-02 ENCOUNTER — Telehealth: Payer: Self-pay | Admitting: Pharmacist

## 2024-09-02 NOTE — Telephone Encounter (Signed)
 Patient contacted for follow-up of diabetes control  Since last contact patient reports she does not check home glucose.  Willing/interested to have follow-up A1C.  PRefers Monday or Tuesday, unfortunately, PCP, Dr. McDiarmid does not have any opennings on those days for the next 6 weeks.   Current Medications include: glipizide and metformin . Patient denies any significant medication related side effects.  Scheduled for Monday 10/29 at 9:00 AM  Total time with patient call and documentation of interaction: 6 minutes.

## 2024-09-02 NOTE — Telephone Encounter (Signed)
 Reviewed and agree with Dr Rennis plan.

## 2024-09-15 ENCOUNTER — Other Ambulatory Visit (HOSPITAL_COMMUNITY): Payer: Self-pay

## 2024-09-15 ENCOUNTER — Encounter: Payer: Self-pay | Admitting: Pharmacist

## 2024-09-15 ENCOUNTER — Ambulatory Visit (INDEPENDENT_AMBULATORY_CARE_PROVIDER_SITE_OTHER): Admitting: Pharmacist

## 2024-09-15 VITALS — BP 134/84 | HR 71 | Wt 215.6 lb

## 2024-09-15 DIAGNOSIS — I152 Hypertension secondary to endocrine disorders: Secondary | ICD-10-CM | POA: Diagnosis not present

## 2024-09-15 DIAGNOSIS — E785 Hyperlipidemia, unspecified: Secondary | ICD-10-CM | POA: Diagnosis not present

## 2024-09-15 DIAGNOSIS — E1365 Other specified diabetes mellitus with hyperglycemia: Secondary | ICD-10-CM

## 2024-09-15 DIAGNOSIS — E1169 Type 2 diabetes mellitus with other specified complication: Secondary | ICD-10-CM

## 2024-09-15 DIAGNOSIS — E1159 Type 2 diabetes mellitus with other circulatory complications: Secondary | ICD-10-CM

## 2024-09-15 LAB — POCT GLYCOSYLATED HEMOGLOBIN (HGB A1C): HbA1c, POC (controlled diabetic range): 7.2 % — AB (ref 0.0–7.0)

## 2024-09-15 MED ORDER — ROSUVASTATIN CALCIUM 10 MG PO TABS
10.0000 mg | ORAL_TABLET | Freq: Every day | ORAL | 3 refills | Status: AC
Start: 2024-09-15 — End: ?
  Filled 2024-09-15: qty 90, 90d supply, fill #0

## 2024-09-15 MED ORDER — LOSARTAN POTASSIUM 100 MG PO TABS
100.0000 mg | ORAL_TABLET | Freq: Every day | ORAL | 3 refills | Status: AC
  Filled 2024-09-15: qty 90, 90d supply, fill #0
  Filled 2024-12-09: qty 90, 90d supply, fill #1

## 2024-09-15 NOTE — Assessment & Plan Note (Signed)
 ASCVD risk - primary prevention in patient with diabetes. Last LDL is 132 not at goal of <29 mg/dL. ASCVD risk factors include T2DM, hypertension, hyperlipidemia, and 10-year ASCVD risk score of 18.8. History of myopathy with atorvastatin  daily, has never taken at a reduced dosing frequency. -Started rosuvastatin 10 mg - plan to start with taking 3 days weekly and increasing to every day as tolerated - with plan to contact office if unable to tolerate -Consider Lipid panel at future appointment

## 2024-09-15 NOTE — Progress Notes (Signed)
 Reviewed and agree with Dr Rennis plan.

## 2024-09-15 NOTE — Assessment & Plan Note (Addendum)
 LIBERATE Study: Potential -Patient's A1c of 7.2 prevents ability to enter into LIBERATE study  Diabetes longstanding currently improved control with A1c down to 7.2 on Metformin  monotherapy.  Patient is able to verbalize appropriate hypoglycemia management plan. Medication adherence appears good. Control improved with diet and exercise changes. Patient states that with improved A1c she will be able to get new glasses.  -Continued metformin  1000 mg QAM and 1500 mg QPM -Patient educated on purpose, proper use, and potential adverse effects.  -Extensively discussed pathophysiology of diabetes, recommended lifestyle interventions, dietary effects on blood sugar control.  -Counseled on s/sx of and management of hypoglycemia.  -Patient became emotional after news of A1c improvement, congratulated her on good work, and encouraged continuation of lifestyle changes

## 2024-09-15 NOTE — Progress Notes (Signed)
 S:     Chief Complaint  Patient presents with   Medication Management    Potential LIBERATE - pending new phone -  disqualified due to improved A1C   63 y.o. female who presents for diabetes evaluation, education, and management in the context of the LIBERATE Study.  Today, patient arrives in good spirits and presents without any assistance. Patient off work Mon/Tues - works in a [employer] 3 days weekly, and [employer] part time.   Patient's phone's storage is currently full - new phone is ordered and should arrive this week.   Patient was referred and last seen by Primary Care Provider, Dr. Keneth, on 01/31/2024.  At last visit, increase her metformin  1000 mg tab to one in am and 1&1/2 in pm. Patient unable to afford SGLT2 or Rybelsus  (semaglutide ).    PMH is significant for T2DM, hypertension, hyperlipidemia.   Patient reports Diabetes was diagnosed a long time ago late ~1980s.   Family/Social History: father, brothers, and extended family with DM  Current diabetes medications include: metformin  1000 mg QAM and 1500 mg QPM -- glimepiride  2 mg once daily on med list - patient reports she stopped taking this medication due to cost Current hypertension medications include: hydrochlorothiazide  25 mg once daily, losartan  50 mg once daily Current hyperlipidemia medications include: None - intolerance to atorvastatin  (muscle aching)   Patient reports adherence to taking all medications as prescribed.   Do you feel that your medications are working for you? yes Have you been experiencing any side effects to the medications prescribed? no Do you have any problems obtaining medications due to transportation or finances? yes Insurance coverage: BCBS  Patient reports hypoglycemic events. -- specifically when not eating breakfast and unable to eat lunch until later in day  Patient reports nocturia (nighttime urination). - 1x nightly - drinks a lot of water Patient reports  neuropathy (nerve pain). - longstanding tingling in hands at night Patient denies visual changes. Patient reports self foot exams.   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  O:   Review of Systems  Neurological:  Positive for tingling (Bilateral hand tingling at night).  All other systems reviewed and are negative.   Physical Exam Vitals reviewed.  Constitutional:      Appearance: Normal appearance.  Pulmonary:     Effort: Pulmonary effort is normal.  Neurological:     Mental Status: She is alert.  Psychiatric:        Mood and Affect: Mood normal.        Behavior: Behavior normal.        Thought Content: Thought content normal.        Judgment: Judgment normal.     Lab Results  Component Value Date   HGBA1C 7.2 (A) 09/15/2024    Vitals:   09/15/24 0947  BP: 134/84  Pulse: 71  SpO2: 100%    Lipid Panel     Component Value Date/Time   CHOL 210 (H) 10/11/2023 1543  TRIG 59 10/11/2023 1543   HDL 67 10/11/2023 1543   CHOLHDL 3.1 10/11/2023 1543   CHOLHDL 3.5 06/08/2015 1114   VLDL 11 06/08/2015 1114   LDLCALC 132 (H) 10/11/2023 1543   LDLDIRECT 112 (H) 12/31/2012 0837    Clinical Atherosclerotic Cardiovascular Disease (ASCVD): No  The 10-year ASCVD risk score (Arnett DK, et al., 2019) is: 18.8%   Values used to calculate the score:     Age: 26 years     Clincally relevant sex: Female     Is Non-Hispanic African American:  Yes     Diabetic: Yes     Tobacco smoker: No     Systolic Blood Pressure: 134 mmHg     Is BP treated: Yes     HDL Cholesterol: 67 mg/dL     Total Cholesterol: 210 mg/dL   A/P: LIBERATE Study: Potential -Patient's A1c of 7.2 prevents ability to enter into LIBERATE study  Diabetes longstanding currently improved control with A1c down to 7.2 on Metformin  monotherapy.  Patient is able to verbalize appropriate hypoglycemia management plan. Medication adherence appears good. Control improved with diet and exercise changes. Patient states that with improved A1c she will be able to get new glasses.  -Continued metformin  1000 mg QAM and 1500 mg QPM -Patient educated on purpose, proper use, and potential adverse effects.  -Extensively discussed pathophysiology of diabetes, recommended lifestyle interventions, dietary effects on blood sugar control.  -Counseled on s/sx of and management of hypoglycemia.  -Patient became emotional after news of A1c improvement, congratulated her on good work, and encouraged continuation of lifestyle changes  ASCVD risk - primary prevention in patient with diabetes. Last LDL is 132 not at goal of <29 mg/dL. ASCVD risk factors include T2DM, hypertension, hyperlipidemia, and 10-year ASCVD risk score of 18.8. History of myopathy with atorvastatin  daily, has never taken at a reduced dosing frequency. -Started rosuvastatin 10 mg - plan to start with taking 3 days weekly and increasing to every day as tolerated - with plan to contact office if unable to tolerate -Consider lipid panel at future appointment  Hypertension longstanding currently uncontrolled. Blood pressure goal of <130/80  mmHg. Medication adherence good. Blood pressure control is suboptimal due to suboptimal medication regimen. -Increased losartan  50 mg to 100 mg daily - to take 2 50 mg tablets until supply is gone  -Continued hydrochlorothiazide  25 mg once daily -Contacted pharmacy and attempted to get 2-in-1  pill for patient ease, but unable to be done at this time due to cost and lack of insurance coverage   Patient denied flu shot at this appointment, amenable to getting COVID-19 booster at local pharmacy  Written patient instructions provided. Patient verbalized understanding of treatment plan.  Total time in face to face counseling 37 minutes.    Follow-up:  Pharmacist TBD. PCP clinic visit before end of year.  Patient seen with Fonda Blase, PharmD Candidate - PY3 student and Estefana Blase, PharmD Candidate - PY4 student.

## 2024-09-15 NOTE — Assessment & Plan Note (Addendum)
 Hypertension longstanding currently uncontrolled. Blood pressure goal of <130/80  mmHg. Medication adherence good. Blood pressure control is suboptimal due to suboptimal medication regimen. -Increased losartan  50 mg to 100 mg daily - to take 2 50 mg tablets until supply is gone  -Continued hydrochlorothiazide  25 mg once daily -Contacted pharmacy and attempted to get 2-in-1 pill for patient ease, but unable to be done at this time due to cost and lack of insurance coverage

## 2024-09-15 NOTE — Patient Instructions (Addendum)
 It was nice to see you today!  Your goal blood sugar is 80-130 before eating and less than 180 after eating.  Medication Changes - Start rosuvastatin 10 mg once daily - you can start by taking this 3 days weekly (M,W,F) and if you are able to tolerate this try to increase to every day of the week - please call if you are unable to tolerate the medication  -Increase losartan  from 50 mg once daily to 100 mg once daily - you can take 2 50 mg tablets until you run out and then pick up the new prescription and take 1 pill daily of higher dose   Continue all other medication the same.   Monitor blood sugars at home and keep a log (glucometer or piece of paper) to bring with you to your next visit.  Keep up the good work with diet and exercise!! Aim for a diet full of vegetables, fruit and lean meats (chicken, [place], fish). Try to limit salt intake by eating fresh or frozen vegetables (instead of canned), rinse canned vegetables prior to cooking and do not add any additional salt to meals.   Consider getting the COVID-19 vaccine - you should be able to get it at your local pharamcy

## 2024-09-18 ENCOUNTER — Other Ambulatory Visit (HOSPITAL_COMMUNITY): Payer: Self-pay

## 2024-10-13 ENCOUNTER — Telehealth: Payer: Self-pay | Admitting: Pharmacist

## 2024-10-13 NOTE — Telephone Encounter (Signed)
 Attempted to contact patient for follow-up of Diabetes QI measures  Patient in need of Lipids, UACR and BMP   - A1c due in late 11/2024  Left HIPAA compliant voice mail requesting call back to schedule PCP visit phone: [phone]   Total time with patient call and documentation of interaction: 8 minutes.

## 2024-11-14 ENCOUNTER — Other Ambulatory Visit (HOSPITAL_COMMUNITY): Payer: Self-pay

## 2024-11-14 ENCOUNTER — Other Ambulatory Visit: Payer: Self-pay | Admitting: Family Medicine

## 2024-11-14 DIAGNOSIS — I152 Hypertension secondary to endocrine disorders: Secondary | ICD-10-CM

## 2024-11-17 ENCOUNTER — Other Ambulatory Visit (HOSPITAL_COMMUNITY): Payer: Self-pay

## 2024-11-17 MED ORDER — HYDROCHLOROTHIAZIDE 25 MG PO TABS
25.0000 mg | ORAL_TABLET | Freq: Every day | ORAL | 0 refills | Status: AC
  Filled 2024-11-17: qty 90, 90d supply, fill #0
